# Patient Record
Sex: Female | Born: 1940 | Race: White | Hispanic: No | Marital: Single | Smoking: Never smoker
Health system: Southern US, Community
[De-identification: ages and names within clinical notes are randomized; demographics above are authoritative.]

## PROBLEM LIST (undated history)

## (undated) DIAGNOSIS — E785 Hyperlipidemia, unspecified: Secondary | ICD-10-CM

## (undated) DIAGNOSIS — R011 Cardiac murmur, unspecified: Secondary | ICD-10-CM

## (undated) DIAGNOSIS — K5792 Diverticulitis of intestine, part unspecified, without perforation or abscess without bleeding: Secondary | ICD-10-CM

## (undated) DIAGNOSIS — T7840XA Allergy, unspecified, initial encounter: Secondary | ICD-10-CM

## (undated) DIAGNOSIS — K219 Gastro-esophageal reflux disease without esophagitis: Secondary | ICD-10-CM

## (undated) DIAGNOSIS — B019 Varicella without complication: Secondary | ICD-10-CM

## (undated) DIAGNOSIS — M1990 Unspecified osteoarthritis, unspecified site: Secondary | ICD-10-CM

## (undated) DIAGNOSIS — I10 Essential (primary) hypertension: Secondary | ICD-10-CM

## (undated) DIAGNOSIS — K635 Polyp of colon: Secondary | ICD-10-CM

## (undated) HISTORY — DX: Cardiac murmur, unspecified: R01.1

## (undated) HISTORY — DX: Allergy, unspecified, initial encounter: T78.40XA

## (undated) HISTORY — DX: Polyp of colon: K63.5

## (undated) HISTORY — DX: Varicella without complication: B01.9

## (undated) HISTORY — DX: Gastro-esophageal reflux disease without esophagitis: K21.9

## (undated) HISTORY — DX: Essential (primary) hypertension: I10

## (undated) HISTORY — DX: Hyperlipidemia, unspecified: E78.5

## (undated) HISTORY — DX: Unspecified osteoarthritis, unspecified site: M19.90

## (undated) HISTORY — DX: Diverticulitis of intestine, part unspecified, without perforation or abscess without bleeding: K57.92

---

## 1998-04-16 ENCOUNTER — Ambulatory Visit (HOSPITAL_COMMUNITY): Admission: RE | Admit: 1998-04-16 | Discharge: 1998-04-16 | Payer: Self-pay | Admitting: *Deleted

## 1998-05-04 ENCOUNTER — Other Ambulatory Visit: Admission: RE | Admit: 1998-05-04 | Discharge: 1998-05-04 | Payer: Self-pay | Admitting: *Deleted

## 1999-05-06 ENCOUNTER — Other Ambulatory Visit: Admission: RE | Admit: 1999-05-06 | Discharge: 1999-05-06 | Payer: Self-pay | Admitting: Obstetrics and Gynecology

## 2000-04-29 ENCOUNTER — Other Ambulatory Visit: Admission: RE | Admit: 2000-04-29 | Discharge: 2000-04-29 | Payer: Self-pay | Admitting: *Deleted

## 2001-04-26 ENCOUNTER — Other Ambulatory Visit: Admission: RE | Admit: 2001-04-26 | Discharge: 2001-04-26 | Payer: Self-pay | Admitting: *Deleted

## 2016-10-28 DIAGNOSIS — Z1231 Encounter for screening mammogram for malignant neoplasm of breast: Secondary | ICD-10-CM | POA: Diagnosis not present

## 2016-12-02 DIAGNOSIS — M19011 Primary osteoarthritis, right shoulder: Secondary | ICD-10-CM | POA: Diagnosis not present

## 2016-12-02 DIAGNOSIS — M19012 Primary osteoarthritis, left shoulder: Secondary | ICD-10-CM | POA: Diagnosis not present

## 2016-12-02 DIAGNOSIS — G8929 Other chronic pain: Secondary | ICD-10-CM | POA: Diagnosis not present

## 2016-12-02 DIAGNOSIS — M7550 Bursitis of unspecified shoulder: Secondary | ICD-10-CM | POA: Diagnosis not present

## 2016-12-02 DIAGNOSIS — Z6824 Body mass index (BMI) 24.0-24.9, adult: Secondary | ICD-10-CM | POA: Diagnosis not present

## 2016-12-02 DIAGNOSIS — M25512 Pain in left shoulder: Secondary | ICD-10-CM | POA: Diagnosis not present

## 2016-12-02 DIAGNOSIS — M25511 Pain in right shoulder: Secondary | ICD-10-CM | POA: Diagnosis not present

## 2016-12-04 DIAGNOSIS — G8929 Other chronic pain: Secondary | ICD-10-CM | POA: Diagnosis not present

## 2016-12-04 DIAGNOSIS — M25612 Stiffness of left shoulder, not elsewhere classified: Secondary | ICD-10-CM | POA: Diagnosis not present

## 2016-12-04 DIAGNOSIS — M25511 Pain in right shoulder: Secondary | ICD-10-CM | POA: Diagnosis not present

## 2016-12-04 DIAGNOSIS — M6281 Muscle weakness (generalized): Secondary | ICD-10-CM | POA: Diagnosis not present

## 2016-12-04 DIAGNOSIS — M25512 Pain in left shoulder: Secondary | ICD-10-CM | POA: Diagnosis not present

## 2016-12-08 DIAGNOSIS — G8929 Other chronic pain: Secondary | ICD-10-CM | POA: Diagnosis not present

## 2016-12-08 DIAGNOSIS — M25512 Pain in left shoulder: Secondary | ICD-10-CM | POA: Diagnosis not present

## 2016-12-08 DIAGNOSIS — M25511 Pain in right shoulder: Secondary | ICD-10-CM | POA: Diagnosis not present

## 2016-12-08 DIAGNOSIS — M25612 Stiffness of left shoulder, not elsewhere classified: Secondary | ICD-10-CM | POA: Diagnosis not present

## 2016-12-08 DIAGNOSIS — M6281 Muscle weakness (generalized): Secondary | ICD-10-CM | POA: Diagnosis not present

## 2016-12-23 DIAGNOSIS — M25511 Pain in right shoulder: Secondary | ICD-10-CM | POA: Diagnosis not present

## 2016-12-23 DIAGNOSIS — M25512 Pain in left shoulder: Secondary | ICD-10-CM | POA: Diagnosis not present

## 2016-12-23 DIAGNOSIS — M6281 Muscle weakness (generalized): Secondary | ICD-10-CM | POA: Diagnosis not present

## 2016-12-23 DIAGNOSIS — G8929 Other chronic pain: Secondary | ICD-10-CM | POA: Diagnosis not present

## 2016-12-23 DIAGNOSIS — M25612 Stiffness of left shoulder, not elsewhere classified: Secondary | ICD-10-CM | POA: Diagnosis not present

## 2016-12-30 DIAGNOSIS — M25512 Pain in left shoulder: Secondary | ICD-10-CM | POA: Diagnosis not present

## 2016-12-30 DIAGNOSIS — M25511 Pain in right shoulder: Secondary | ICD-10-CM | POA: Diagnosis not present

## 2016-12-30 DIAGNOSIS — M6281 Muscle weakness (generalized): Secondary | ICD-10-CM | POA: Diagnosis not present

## 2016-12-30 DIAGNOSIS — G8929 Other chronic pain: Secondary | ICD-10-CM | POA: Diagnosis not present

## 2016-12-30 DIAGNOSIS — M25612 Stiffness of left shoulder, not elsewhere classified: Secondary | ICD-10-CM | POA: Diagnosis not present

## 2017-01-06 DIAGNOSIS — G8929 Other chronic pain: Secondary | ICD-10-CM | POA: Diagnosis not present

## 2017-01-06 DIAGNOSIS — M6281 Muscle weakness (generalized): Secondary | ICD-10-CM | POA: Diagnosis not present

## 2017-01-06 DIAGNOSIS — M25612 Stiffness of left shoulder, not elsewhere classified: Secondary | ICD-10-CM | POA: Diagnosis not present

## 2017-01-06 DIAGNOSIS — M25511 Pain in right shoulder: Secondary | ICD-10-CM | POA: Diagnosis not present

## 2017-01-06 DIAGNOSIS — M25512 Pain in left shoulder: Secondary | ICD-10-CM | POA: Diagnosis not present

## 2017-01-12 DIAGNOSIS — M25612 Stiffness of left shoulder, not elsewhere classified: Secondary | ICD-10-CM | POA: Diagnosis not present

## 2017-03-06 DIAGNOSIS — R42 Dizziness and giddiness: Secondary | ICD-10-CM | POA: Diagnosis not present

## 2017-03-06 DIAGNOSIS — E785 Hyperlipidemia, unspecified: Secondary | ICD-10-CM | POA: Diagnosis not present

## 2017-03-06 DIAGNOSIS — I10 Essential (primary) hypertension: Secondary | ICD-10-CM | POA: Diagnosis not present

## 2017-03-06 DIAGNOSIS — Z6824 Body mass index (BMI) 24.0-24.9, adult: Secondary | ICD-10-CM | POA: Diagnosis not present

## 2017-04-01 DIAGNOSIS — H81399 Other peripheral vertigo, unspecified ear: Secondary | ICD-10-CM | POA: Diagnosis not present

## 2017-04-01 DIAGNOSIS — H90A22 Sensorineural hearing loss, unilateral, left ear, with restricted hearing on the contralateral side: Secondary | ICD-10-CM | POA: Diagnosis not present

## 2017-04-01 DIAGNOSIS — R42 Dizziness and giddiness: Secondary | ICD-10-CM | POA: Diagnosis not present

## 2017-04-01 DIAGNOSIS — Z6825 Body mass index (BMI) 25.0-25.9, adult: Secondary | ICD-10-CM | POA: Diagnosis not present

## 2017-04-14 DIAGNOSIS — Z85828 Personal history of other malignant neoplasm of skin: Secondary | ICD-10-CM | POA: Diagnosis not present

## 2017-04-14 DIAGNOSIS — Z08 Encounter for follow-up examination after completed treatment for malignant neoplasm: Secondary | ICD-10-CM | POA: Diagnosis not present

## 2017-04-14 DIAGNOSIS — L570 Actinic keratosis: Secondary | ICD-10-CM | POA: Diagnosis not present

## 2017-04-24 DIAGNOSIS — H353131 Nonexudative age-related macular degeneration, bilateral, early dry stage: Secondary | ICD-10-CM | POA: Diagnosis not present

## 2017-04-24 DIAGNOSIS — H40013 Open angle with borderline findings, low risk, bilateral: Secondary | ICD-10-CM | POA: Diagnosis not present

## 2017-04-24 DIAGNOSIS — H2513 Age-related nuclear cataract, bilateral: Secondary | ICD-10-CM | POA: Diagnosis not present

## 2017-04-24 DIAGNOSIS — H1851 Endothelial corneal dystrophy: Secondary | ICD-10-CM | POA: Diagnosis not present

## 2017-04-24 DIAGNOSIS — H04123 Dry eye syndrome of bilateral lacrimal glands: Secondary | ICD-10-CM | POA: Diagnosis not present

## 2017-09-04 DIAGNOSIS — Z79899 Other long term (current) drug therapy: Secondary | ICD-10-CM | POA: Diagnosis not present

## 2017-09-04 DIAGNOSIS — E785 Hyperlipidemia, unspecified: Secondary | ICD-10-CM | POA: Diagnosis not present

## 2017-09-04 DIAGNOSIS — R42 Dizziness and giddiness: Secondary | ICD-10-CM | POA: Diagnosis not present

## 2017-09-04 DIAGNOSIS — I10 Essential (primary) hypertension: Secondary | ICD-10-CM | POA: Diagnosis not present

## 2017-10-05 DIAGNOSIS — M25531 Pain in right wrist: Secondary | ICD-10-CM | POA: Diagnosis not present

## 2017-10-05 DIAGNOSIS — M1811 Unilateral primary osteoarthritis of first carpometacarpal joint, right hand: Secondary | ICD-10-CM | POA: Diagnosis not present

## 2017-10-05 DIAGNOSIS — M8588 Other specified disorders of bone density and structure, other site: Secondary | ICD-10-CM | POA: Diagnosis not present

## 2017-10-05 DIAGNOSIS — S6981XA Other specified injuries of right wrist, hand and finger(s), initial encounter: Secondary | ICD-10-CM | POA: Diagnosis not present

## 2017-10-20 DIAGNOSIS — S6981XD Other specified injuries of right wrist, hand and finger(s), subsequent encounter: Secondary | ICD-10-CM | POA: Diagnosis not present

## 2018-04-20 DIAGNOSIS — I10 Essential (primary) hypertension: Secondary | ICD-10-CM | POA: Diagnosis not present

## 2018-04-20 DIAGNOSIS — E785 Hyperlipidemia, unspecified: Secondary | ICD-10-CM | POA: Diagnosis not present

## 2018-04-20 DIAGNOSIS — T753XXA Motion sickness, initial encounter: Secondary | ICD-10-CM | POA: Diagnosis not present

## 2018-04-30 DIAGNOSIS — H1851 Endothelial corneal dystrophy: Secondary | ICD-10-CM | POA: Diagnosis not present

## 2018-04-30 DIAGNOSIS — H2513 Age-related nuclear cataract, bilateral: Secondary | ICD-10-CM | POA: Diagnosis not present

## 2018-05-10 DIAGNOSIS — Z08 Encounter for follow-up examination after completed treatment for malignant neoplasm: Secondary | ICD-10-CM | POA: Diagnosis not present

## 2018-05-10 DIAGNOSIS — Z86008 Personal history of in-situ neoplasm of other site: Secondary | ICD-10-CM | POA: Diagnosis not present

## 2018-05-10 DIAGNOSIS — C44519 Basal cell carcinoma of skin of other part of trunk: Secondary | ICD-10-CM | POA: Diagnosis not present

## 2018-05-10 DIAGNOSIS — L821 Other seborrheic keratosis: Secondary | ICD-10-CM | POA: Diagnosis not present

## 2018-05-10 DIAGNOSIS — Z85828 Personal history of other malignant neoplasm of skin: Secondary | ICD-10-CM | POA: Diagnosis not present

## 2018-05-10 DIAGNOSIS — D485 Neoplasm of uncertain behavior of skin: Secondary | ICD-10-CM | POA: Diagnosis not present

## 2018-05-19 DIAGNOSIS — C44519 Basal cell carcinoma of skin of other part of trunk: Secondary | ICD-10-CM | POA: Diagnosis not present

## 2018-08-21 NOTE — Progress Notes (Addendum)
[place] at [facility] [address] [place], [place] [address] [phone]  Date:  08/25/2018   Name:  Katrina Lucas   DOB:  03-17-1941   MRN:  [id number]  PCP:  Darreld Mclean, MD    Chief Complaint: New Patient (Initial Visit) (immunizations)   History of Present Illness:  Katrina Lucas is a 77 y.o. 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 She had worked at 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 Valora Piccolo, but he left the practice so she decided to change to this office Labs done in April of this year- lipids done then   She has not had her pneumovax 23 yet She did have a tetanus in 2011 She had zostavax about 4 years ago we think   Pt notes a family history of CV disease  Her mother died of a stroke Her sister had AV replacement a year ago, and is having a stent put in her arm and probably in her carotid Brother died of an MI at age 81, her father died of MI at age 66  Pt does not have any CP  She has done a stress test several years ago  Last BP was 132/72 at her last MD office in  April No HA  She did take her BP meds this am   Patient Active Problem List   Diagnosis Date Noted  . Essential hypertension 08/25/2018  . Peripheral polyneuropathy 08/25/2018  . Dyslipidemia 08/25/2018  . Bilateral hearing loss 08/25/2018    Past Medical History:  Diagnosis Date  . Allergy   . Arthritis   . Chicken pox   . Colon polyp   . Diverticulitis   . GERD (gastroesophageal reflux disease)   . Heart murmur   . Hyperlipidemia   . Hypertension     History reviewed. No pertinent surgical history.  Social History   Tobacco Use  . Smoking status: Never Smoker  . Smokeless tobacco: Never Used  Substance Use Topics  . Alcohol use: Yes    Comment: 5 times a week  . Drug use: Never    History reviewed. No pertinent family history.  Allergies  Allergen Reactions  . Sulfa Antibiotics Shortness Of Breath  . Aspirin     Microscopic bleeding Microscopic bleeding   . Codeine Nausea And Vomiting    Medication list has been reviewed and updated.  Current Outpatient Medications on File Prior to Visit  Medication Sig Dispense Refill  .  acetaminophen (TYLENOL) 325 MG tablet Take by mouth.    . DOCOSAHEXAENOIC ACID PO Take by mouth.    Marland Kitchen UNABLE TO FIND Med Name: Align GI one capsule daily    . vitamin B-12 (CYANOCOBALAMIN) 1000 MCG tablet Take by mouth.     No current facility-administered medications on file prior to visit.     Review of Systems:  As per HPI- otherwise negative. No fever or chills Her feet do not hurt or burn  No rash No CP with activity    Physical Examination: Vitals:   08/25/18 0958  BP: (!) 162/78  Pulse: 72  Resp: 16  Temp: 97.9 F (36.6 C)  SpO2: 97%   Vitals:   08/25/18 0958  Weight: 169 lb (76.7 kg)  Height: 5' 9.5" (1.765 m)   Body mass index is 24.6 kg/m. Ideal Body Weight: Weight in (lb) to have BMI = 25: 171.4  GEN: WDWN, NAD, Non-toxic, A & O x 3, looks well, normal weight  Mild HOH HEENT: Atraumatic,  Normocephalic. Neck supple. No masses, No LAD.  Wearing hearing aids, oropharynx normal.  PEERL,EOMI.   Ears and Nose: No external deformity. CV: RRR, No M/G/R. No JVD. No thrill. No extra heart sounds. PULM: CTA B, no wheezes, crackles, rhonchi. No retractions. No resp. distress. No accessory muscle use. EXTR: No c/c/e NEURO Normal gait.  PSYCH: Normally interactive. Conversant. Not depressed or anxious appearing.  Calm demeanor.    Assessment and Plan: Immunization due - Plan: Pneumococcal polysaccharide vaccine 23-valent greater than or equal to 2yo subcutaneous/IM  Screening for osteoporosis - Plan: DG Bone Density  Estrogen deficiency - Plan: DG Bone Density  Essential hypertension  Dyslipidemia  Hypercalcemia - Plan: Basic metabolic panel  Medication monitoring encounter - Plan: Basic metabolic panel  Peripheral polyneuropathy  Bilateral hearing loss, unspecified hearing loss type  BP is elevated today- she will come in for a nurse visit and recheck next week.  If still high consider adjusting her medications Ordered dexa for her Noted her calcium was a bit elevated on her last labs, repeat today She does not wish to start on any medication for her neuropathy at this time.  She wears shoes to protect her feet Plan to visit in April for a physical and routine labs Will plan further follow- up pending labs. Pneumovax today She will do her flu shot later this fall Discussed shingrix- just had zostavax in 2015 so too early    Signed Lamar Blinks, MD  Received her labs as below, letter to pt  Results for orders placed or performed in visit on 81/27/51  Basic metabolic panel  Result Value Ref Range   Sodium 138 135 - 145 mEq/L   Potassium 4.2 3.5 - 5.1 mEq/L   Chloride 102 96 - 112 mEq/L   CO2 30 19 - 32 mEq/L   Glucose, Bld 96 70 - 99 mg/dL   BUN 15 6 - 23 mg/dL   Creatinine, Ser 0.84 0.40 - 1.20 mg/dL   Calcium 10.4 8.4 - 10.5 mg/dL   GFR 69.48 >60.00  mL/min

## 2018-08-25 ENCOUNTER — Ambulatory Visit (INDEPENDENT_AMBULATORY_CARE_PROVIDER_SITE_OTHER): Payer: Medicare Other | Admitting: Family Medicine

## 2018-08-25 ENCOUNTER — Encounter: Payer: Self-pay | Admitting: Family Medicine

## 2018-08-25 VITALS — BP 162/78 | HR 72 | Temp 97.9°F | Resp 16 | Ht 69.5 in | Wt 169.0 lb

## 2018-08-25 DIAGNOSIS — I10 Essential (primary) hypertension: Secondary | ICD-10-CM | POA: Diagnosis not present

## 2018-08-25 DIAGNOSIS — Z13820 Encounter for screening for osteoporosis: Secondary | ICD-10-CM | POA: Diagnosis not present

## 2018-08-25 DIAGNOSIS — H9193 Unspecified hearing loss, bilateral: Secondary | ICD-10-CM

## 2018-08-25 DIAGNOSIS — E785 Hyperlipidemia, unspecified: Secondary | ICD-10-CM | POA: Insufficient documentation

## 2018-08-25 DIAGNOSIS — Z23 Encounter for immunization: Secondary | ICD-10-CM

## 2018-08-25 DIAGNOSIS — G629 Polyneuropathy, unspecified: Secondary | ICD-10-CM | POA: Insufficient documentation

## 2018-08-25 DIAGNOSIS — E2839 Other primary ovarian failure: Secondary | ICD-10-CM

## 2018-08-25 DIAGNOSIS — Z5181 Encounter for therapeutic drug level monitoring: Secondary | ICD-10-CM

## 2018-08-25 LAB — BASIC METABOLIC PANEL
BUN: 15 mg/dL (ref 6–23)
CALCIUM: 10.4 mg/dL (ref 8.4–10.5)
CO2: 30 mEq/L (ref 19–32)
CREATININE: 0.84 mg/dL (ref 0.40–1.20)
Chloride: 102 mEq/L (ref 96–112)
GFR: 69.48 mL/min (ref >60.00)
Glucose, Bld: 96 mg/dL (ref 70–99)
Potassium: 4.2 mEq/L (ref 3.5–5.1)
SODIUM: 138 meq/L (ref 135–145)

## 2018-08-25 MED ORDER — LOSARTAN POTASSIUM-HCTZ 50-12.5 MG PO TABS: 1.0000 | ORAL_TABLET | Freq: Every day | ORAL | 3 refills | Status: DC

## 2018-08-25 MED ORDER — PRAVASTATIN SODIUM 20 MG PO TABS: 20.0000 mg | ORAL_TABLET | Freq: Every day | ORAL | 3 refills | Status: DC

## 2018-08-25 NOTE — Patient Instructions (Signed)
It was good to see you again today!  You got your 2nd pneumonia vaccine today Don't forget a flu shot later on this fall I will check on your calcium today We will do a nurse visit in about one week and adjust your BP meds if needed   I also ordered a bone density scan for you- I will set this up

## 2018-09-01 ENCOUNTER — Ambulatory Visit (INDEPENDENT_AMBULATORY_CARE_PROVIDER_SITE_OTHER): Payer: Medicare Other | Admitting: Family Medicine

## 2018-09-01 VITALS — BP 145/81 | HR 60

## 2018-09-01 DIAGNOSIS — I10 Essential (primary) hypertension: Secondary | ICD-10-CM | POA: Diagnosis not present

## 2018-09-01 NOTE — Progress Notes (Signed)
Pre visit review using our clinic tool,if applicable. No additional management support is needed unless otherwise documented below in the visit note.   Pt here for Blood pressure check per order from Dr. Lenna Sciara. [place]  Dated 08/25/18.  Pt currently takes: Losartan -Hctz 50-12.5 mg  No complaints voiced this visit.   Pt reports compliance with medication.  BP today @ = 145/81 P = 60  Pt advised per Dr. Lorelei Pont to continue medications as ordered and return in 3 months for BP follow up visit.

## 2018-09-06 NOTE — Progress Notes (Signed)
I have reviewed the associated BP check note by Santiago Glad and agreeDenny Peon MD

## 2018-09-22 DIAGNOSIS — C44519 Basal cell carcinoma of skin of other part of trunk: Secondary | ICD-10-CM | POA: Diagnosis not present

## 2018-09-27 ENCOUNTER — Ambulatory Visit (HOSPITAL_BASED_OUTPATIENT_CLINIC_OR_DEPARTMENT_OTHER)
Admission: RE | Admit: 2018-09-27 | Discharge: 2018-09-27 | Disposition: A | Discharge status: Home / Self Care | Payer: Medicare Other | Source: Ambulatory Visit | Attending: Family Medicine | Admitting: Family Medicine

## 2018-09-27 DIAGNOSIS — Z13820 Encounter for screening for osteoporosis: Secondary | ICD-10-CM

## 2018-09-27 DIAGNOSIS — E2839 Other primary ovarian failure: Secondary | ICD-10-CM | POA: Diagnosis not present

## 2018-09-27 DIAGNOSIS — M85852 Other specified disorders of bone density and structure, left thigh: Secondary | ICD-10-CM | POA: Diagnosis not present

## 2018-09-27 DIAGNOSIS — M810 Age-related osteoporosis without current pathological fracture: Secondary | ICD-10-CM | POA: Insufficient documentation

## 2018-09-27 DIAGNOSIS — M85851 Other specified disorders of bone density and structure, right thigh: Secondary | ICD-10-CM | POA: Diagnosis not present

## 2018-09-28 ENCOUNTER — Encounter: Payer: Self-pay | Admitting: Family Medicine

## 2018-09-30 DIAGNOSIS — H2513 Age-related nuclear cataract, bilateral: Secondary | ICD-10-CM | POA: Diagnosis not present

## 2018-09-30 DIAGNOSIS — H1851 Endothelial corneal dystrophy: Secondary | ICD-10-CM | POA: Diagnosis not present

## 2018-10-18 ENCOUNTER — Telehealth: Payer: Self-pay | Admitting: Family Medicine

## 2018-10-18 NOTE — Telephone Encounter (Signed)
Copied from [place] [id number]. Topic: General - Other >> Oct 18, 2018  9:36 AM Oneta Rack wrote:  Relation to pt: self  Call back number: [phone] Pharmacy: [facility], [place] to [facility] [phone] (Phone) [phone] (Fax)  Reason for call:  Patient received 09/28/18 letter from Dr. Lorelei Pont informing patient she would like her to start Fosamax, patient would like Rx sent to [place], [place] to [facility] [phone] (Phone) and  [phone] (Fax) , informed patient please allow 48 to 72 hour turn around time, please advise

## 2018-10-18 NOTE — Telephone Encounter (Signed)
See pt. Request. Thanks. 

## 2018-10-19 MED ORDER — ALENDRONATE SODIUM 70 MG PO TABS: 70.0000 mg | ORAL_TABLET | ORAL | 3 refills | Status: DC

## 2018-10-19 NOTE — Telephone Encounter (Signed)
Done

## 2018-10-26 DIAGNOSIS — H25811 Combined forms of age-related cataract, right eye: Secondary | ICD-10-CM | POA: Diagnosis not present

## 2018-10-26 DIAGNOSIS — H2511 Age-related nuclear cataract, right eye: Secondary | ICD-10-CM | POA: Diagnosis not present

## 2018-10-30 NOTE — Progress Notes (Signed)
[place] at [facility] [address] [place], [place] [address] [phone]  Date:  11/01/2018   Name:  Katrina Lucas   DOB:  [birth year]   MRN:  [id number]  PCP:  Darreld Mclean, MD    Chief Complaint: Hypertension (follow up )   History of Present Illness:  Katrina Lucas is a 77 y.o. very pleasant female patient who presents with the following:  Following up on her BP today Last seen here in early September at which time her BP was high- had her come back in for a nurse visit later in September and this was improved.  Checking in today  BP Readings from Last 3 Encounters:  11/01/18 138/90  09/01/18 (!) 145/81  08/25/18 (!) 162/78   Flu: done at public  She is on losartan/hctz and also pravachol  She had cataract surgery on one eye last week, the other eye is due next week She is hoping that this will improver her ability to read She is feeling well   She is checking her BP at home every couple of days Her SBP is often over 140s/ 150s    Patient Active Problem List   Diagnosis Date Noted  . Essential hypertension 08/25/2018  . Peripheral polyneuropathy 08/25/2018  . Dyslipidemia 08/25/2018  . Bilateral hearing loss 08/25/2018    Past Medical History:  Diagnosis Date  . Allergy   . Arthritis   . Chicken pox   . Colon polyp   . Diverticulitis   . GERD (gastroesophageal reflux disease)   . Heart murmur   . Hyperlipidemia   . Hypertension     No past surgical history on file.  Social History   Tobacco Use  . Smoking status: Never Smoker  . Smokeless tobacco: Never Used  Substance Use Topics  . Alcohol use: Yes    Comment: 5 times a week  . Drug use: Never    No family history on file.  Allergies  Allergen Reactions  . Sulfa Antibiotics Shortness Of Breath  . Aspirin     Microscopic bleeding Microscopic bleeding   . Codeine Nausea And Vomiting    Medication list has been  reviewed and updated.  Current Outpatient Medications on File Prior to Visit  Medication Sig Dispense Refill  . acetaminophen (TYLENOL) 325 MG tablet Take 650 mg by mouth.     Marland Kitchen alendronate (FOSAMAX) 70 MG tablet Take 1 tablet (70 mg total) by mouth every 7 (seven) days. Take with a full glass of water on an empty stomach. 12 tablet 3  . DOCOSAHEXAENOIC ACID PO Take by mouth.    . losartan-hydrochlorothiazide (HYZAAR) 50-12.5 MG tablet Take 1 tablet by mouth daily. 90 tablet 3  . pravastatin (PRAVACHOL) 20 MG tablet Take 1 tablet (20 mg total) by mouth daily. 90 tablet 3  . UNABLE TO FIND Med Name: Align GI one capsule daily    . vitamin B-12 (CYANOCOBALAMIN) 1000 MCG tablet Take by mouth.     No current facility-administered medications on file prior to visit.     Review of Systems:  As per HPI- otherwise negative.   Physical Examination: Vitals:   11/01/18 1337  BP: 138/90  Pulse: 94  Resp: 18  Temp: (!) 97.5 F (36.4 C)  SpO2: 98%   Vitals:   11/01/18 1337  Weight: 170 lb (77.1 kg)  Height: 5' 9.5" (1.765 m)   Body mass index is 24.74  kg/m. Ideal Body Weight: Weight in (lb) to have BMI = 25: 171.4  GEN: WDWN, NAD, Non-toxic, A & O x 3, looks well  HEENT: Atraumatic, Normocephalic. Neck supple. No masses, No LAD. Ears and Nose: No external deformity. CV: RRR, No M/G/R. No JVD. No thrill. No extra heart sounds. PULM: CTA B, no wheezes, crackles, rhonchi. No retractions. No resp. distress. No accessory muscle use. EXTR: No c/c/e NEURO Normal gait.  PSYCH: Normally interactive. Conversant. Not depressed or anxious appearing.  Calm demeanor.    Assessment and Plan: Essential hypertension  Following up on her BP today She has a lot of losartan/hct 50/12.5 on hand and will try taking 1.5 pills.  She will update me with some readings in a few weeks    Signed Lamar Blinks, MD

## 2018-11-01 ENCOUNTER — Ambulatory Visit (INDEPENDENT_AMBULATORY_CARE_PROVIDER_SITE_OTHER): Payer: Medicare Other | Admitting: Family Medicine

## 2018-11-01 ENCOUNTER — Encounter: Payer: Self-pay | Admitting: Family Medicine

## 2018-11-01 VITALS — BP 138/90 | HR 94 | Temp 97.5°F | Resp 18 | Ht 69.5 in | Wt 170.0 lb

## 2018-11-01 DIAGNOSIS — I10 Essential (primary) hypertension: Secondary | ICD-10-CM | POA: Diagnosis not present

## 2018-11-01 NOTE — Patient Instructions (Signed)
Let's increase your BP med to 1.5 pills a day  Please let me know how your BP responds to this- you can send me a list of your readings in a few weeks

## 2018-11-16 DIAGNOSIS — H2512 Age-related nuclear cataract, left eye: Secondary | ICD-10-CM | POA: Diagnosis not present

## 2018-12-08 ENCOUNTER — Ambulatory Visit: Payer: Medicare Other | Admitting: Family Medicine

## 2018-12-09 ENCOUNTER — Telehealth: Payer: Self-pay | Admitting: Family Medicine

## 2018-12-09 NOTE — Telephone Encounter (Signed)
Copied from [place] [id number]. Topic: Quick Communication - See Telephone Encounter >> Dec 09, 2018 11:35 AM Rosalin Hawking wrote: CRM for notification. See Telephone encounter for: 12/09/18.   Pt dropped off document for provider to see and have on pt's chart (white envelope with letter and info about pt's BP) Document put at front office tray under providers name.

## 2018-12-09 NOTE — Telephone Encounter (Signed)
Forwarded to provider/SLS 12/19

## 2018-12-11 ENCOUNTER — Telehealth: Payer: Self-pay | Admitting: Family Medicine

## 2018-12-11 NOTE — Telephone Encounter (Signed)
Pt brought me a list of some BP readings since she increased her dosage of losartan/hctz to 1.5 pills per day.  Overall they look reasonable- a few are higher than goal but generally ok .    Called pt and LMOM to continue current dosage.

## 2019-01-31 ENCOUNTER — Telehealth: Payer: Self-pay | Admitting: Family Medicine

## 2019-01-31 DIAGNOSIS — I10 Essential (primary) hypertension: Secondary | ICD-10-CM

## 2019-01-31 MED ORDER — LOSARTAN POTASSIUM-HCTZ 50-12.5 MG PO TABS: 1.5000 | ORAL_TABLET | Freq: Every day | ORAL | 3 refills | Status: DC

## 2019-01-31 MED ORDER — PRAVASTATIN SODIUM 20 MG PO TABS: 20.0000 mg | ORAL_TABLET | Freq: Every day | ORAL | 3 refills | Status: DC

## 2019-01-31 NOTE — Telephone Encounter (Signed)
Patient needs Pravastatin 20 mg refill. She also needs a new prescription of her blood pressure medication Hyzaar filled with a higher dose. She has been taking 1.5 tabs daily.  Please advise.

## 2019-01-31 NOTE — Telephone Encounter (Signed)
Please advise on increase of Hyzaar. I have sent pravastatin to pharmacy.

## 2019-04-11 ENCOUNTER — Telehealth: Payer: Self-pay | Admitting: Family Medicine

## 2019-04-11 NOTE — Telephone Encounter (Signed)
Patient called and stated she would like to be put on a new blood pressure medication and she wants it to be one that she no longer has to break in half. Please contact patient [phone] (mobile)

## 2019-04-12 MED ORDER — LOSARTAN POTASSIUM-HCTZ 100-12.5 MG PO TABS: 1.0000 | ORAL_TABLET | Freq: Every day | ORAL | 3 refills | Status: DC

## 2019-04-12 NOTE — Addendum Note (Signed)
Addended by: Lamar Blinks C on: 04/12/2019 01:50 PM   Modules accepted: Orders

## 2019-04-12 NOTE — Telephone Encounter (Signed)
Called her back- she is currently taking losartan 75 and hctz 18.75 Will change her to losartan 100/hctz 12.5 which should act about the same as far as lowering her BP Meds ordered this encounter  Medications  . losartan-hydrochlorothiazide (HYZAAR) 100-12.5 MG tablet    Sig: Take 1 tablet by mouth daily.    Dispense:  90 tablet    Refill:  3     BP Readings from Last 3 Encounters:  11/01/18 138/90  09/01/18 (!) 145/81  08/25/18 (!) 162/78   Will have my nurse call her and set up a recheck visit in a month or so Michele Mcalpine please give her a call and set up a follow-up appt for 6-8 weeks from now to check on her BP- thank you

## 2019-04-20 ENCOUNTER — Telehealth: Payer: Self-pay | Admitting: Family Medicine

## 2019-04-20 MED ORDER — PRAVASTATIN SODIUM 20 MG PO TABS: 20.0000 mg | ORAL_TABLET | Freq: Every day | ORAL | 0 refills | Status: DC

## 2019-04-20 NOTE — Telephone Encounter (Signed)
Refill has been sent to pharmacy.  

## 2019-04-20 NOTE — Telephone Encounter (Signed)
Copied from [place] [id number]. Topic: Quick Communication - Rx Refill/Question >> Apr 20, 2019 10:04 AM Virl Axe D wrote: Medication: pravastatin (PRAVACHOL) 20 MG tablet  Has the patient contacted their pharmacy? Yes.   (Agent: If no, request that the patient contact the pharmacy for the refill.) (Agent: If yes, when and what did the pharmacy advise?)  Preferred Pharmacy (with phone number or street name): [facility] [place], [place] to [facility] [phone] (Phone) [phone] (Fax)    Agent: Please be advised that RX refills may take up to 3 business days. We ask that you follow-up with your pharmacy.

## 2019-08-20 NOTE — Progress Notes (Addendum)
[place] at [facility] [address] [place], [place] [address] [phone]  Date:  08/31/2019   Name:  Katrina Lucas   DOB:  [birth year]   MRN:  [id number]  PCP:  Darreld Mclean, MD    Chief Complaint: Annual Exam (delcines flu shot today)   History of Present Illness:  Katrina Lucas is a 78 y.o. very pleasant female patient who presents with the following:  Here today for a CPE History of HTN, hyperlipidemia, polyneuropathy  Last seen by myself in November of last year   Flu: declines today - will do in October at Day: due- she is fasting for labs today Mammo: last done at a different location, unsure of exact date.  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  Worse at night when she goes to bed   Fosamax Pravachol hyzaar   She does check her BP at home- might run 130s/60s generally   She is doing a lot of reading, tries to get exercise  Patient Active Problem List   Diagnosis Date Noted  . Essential hypertension 08/25/2018  . Peripheral polyneuropathy 08/25/2018  . Dyslipidemia 08/25/2018  . Bilateral hearing loss 08/25/2018    Past Medical History:  Diagnosis Date  . Allergy   . Arthritis   . Chicken pox   . Colon polyp   . Diverticulitis   . GERD (gastroesophageal reflux disease)   . Heart murmur   . Hyperlipidemia   . Hypertension     History reviewed. No pertinent surgical history.  Social  History   Tobacco Use  . Smoking status: Never Smoker  . Smokeless tobacco: Never Used  Substance Use Topics  . Alcohol use: Yes    Comment: 5 times a week  . Drug use: Never    History reviewed. No pertinent family history.  Allergies  Allergen Reactions  . Sulfa Antibiotics Shortness Of Breath  . Aspirin     Microscopic bleeding Microscopic bleeding   . Codeine Nausea And Vomiting    Medication list has been reviewed and updated.  Current Outpatient Medications on File Prior to Visit  Medication Sig Dispense Refill  . acetaminophen (TYLENOL) 325 MG tablet Take 650 mg by mouth.     Marland Kitchen alendronate (FOSAMAX) 70 MG tablet Take 1 tablet (70 mg total) by mouth every 7 (seven) days. Take with a full glass of water on an empty stomach. 12 tablet 3  . cetirizine (ZYRTEC) 10 MG tablet Take 10 mg by mouth daily.    . DOCOSAHEXAENOIC ACID PO Take by mouth.    Marland Kitchen LORazepam (ATIVAN) 0.5 MG tablet Take 0.5 mg by mouth as needed (dizziness).    Marland Kitchen losartan-hydrochlorothiazide (HYZAAR) 100-12.5 MG tablet Take 1 tablet by mouth daily. 90 tablet 3  . Multiple Vitamins-Minerals (CENTRUM SILVER 50+WOMEN PO) Take by mouth.    . pravastatin (  PRAVACHOL) 20 MG tablet Take 1 tablet (20 mg total) by mouth daily. 90 tablet 0  . UNABLE TO FIND Med Name: Align GI one capsule daily    . vitamin B-12 (CYANOCOBALAMIN) 1000 MCG tablet Take by mouth.     No current facility-administered medications on file prior to visit.     Review of Systems:  As per HPI- otherwise negative.  No fever or chills, no chest pain or shortness of breath Physical Examination: Vitals:   08/31/19 0857  BP: 112/72  Pulse: 71  Resp: 16  Temp: (!) 97.2 F (36.2 C)  SpO2: 98%   Vitals:   08/31/19 0857  Weight: 173 lb (78.5 kg)  Height: 5' 9.5" (1.765 m)   Body mass index is 25.18 kg/m. Ideal Body Weight: Weight in (lb) to have BMI = 25: 171.4  GEN: WDWN, NAD, Non-toxic, A & O x 3, looks great for age  51:  Atraumatic, Normocephalic. Neck supple. No masses, No LAD.  Bilateral TM wnl, oropharynx normal.  PEERL,EOMI.   Bilateral hearing aids Ears and Nose: No external deformity. CV: RRR, No M/G/R. No JVD. No thrill. No extra heart sounds. PULM: CTA B, no wheezes, crackles, rhonchi. No retractions. No resp. distress. No accessory muscle use. ABD: S, NT, ND, +BS. No rebound. No HSM. EXTR: No c/c/e NEURO Normal gait.  PSYCH: Normally interactive. Conversant. Not depressed or anxious appearing.  Calm demeanor.    Assessment and Plan:   ICD-10-CM   1. Physical exam  Z00.00   2. Essential hypertension  I10 CBC    Comprehensive metabolic panel    losartan-hydrochlorothiazide (HYZAAR) 100-12.5 MG tablet  3. Dyslipidemia  E78.5 Lipid panel    pravastatin (PRAVACHOL) 20 MG tablet  4. Screening for diabetes mellitus  Z13.1 Hemoglobin A1c  5. Screening for breast cancer  Z12.39 MM 3D SCREEN BREAST BILATERAL  6. Screening for colon cancer  Z12.11   7. Age related osteoporosis, unspecified pathological fracture presence  M81.0 alendronate (FOSAMAX) 70 MG tablet   Complete physical today Refill medications Blood pressure under good control She will get her flu shot later this year, recommended Shingrix Ordered screening mammogram Due for colon cancer screening this year, she will contact her GI doctor arrange Will plan further follow- up pending labs.   Follow-up: No follow-ups on file.  Meds ordered this encounter  Medications  . alendronate (FOSAMAX) 70 MG tablet    Sig: Take 1 tablet (70 mg total) by mouth every 7 (seven) days. Take with a full glass of water on an empty stomach.    Dispense:  12 tablet    Refill:  3  . pravastatin (PRAVACHOL) 20 MG tablet    Sig: Take 1 tablet (20 mg total) by mouth daily.    Dispense:  90 tablet    Refill:  3  . losartan-hydrochlorothiazide (HYZAAR) 100-12.5 MG tablet    Sig: Take 1 tablet by mouth daily.    Dispense:  90 tablet    Refill:  3    Orders Placed This Encounter  Procedures  . MM 3D SCREEN BREAST BILATERAL  . CBC  . Comprehensive metabolic panel  . Hemoglobin A1c  . Lipid panel    @SIGN @    Signed Lamar Blinks, MD  Received her labs, letter to patient  Results for orders placed or performed in visit on 08/31/19  CBC  Result Value Ref Range   WBC 7.7 4.0 - 10.5 K/uL   RBC 4.72 3.87 - 5.11 Mil/uL  Platelets 294.0 150.0 - 400.0 K/uL   Hemoglobin 14.0 12.0 - 15.0 g/dL   HCT 42.2 36.0 - 46.0 %   MCV 89.5 78.0 - 100.0 fl   MCHC 33.1 30.0 - 36.0 g/dL   RDW 12.6 11.5 - 15.5 %  Comprehensive metabolic panel  Result Value Ref Range   Sodium 137 135 - 145 mEq/L   Potassium 3.5 3.5 - 5.1 mEq/L   Chloride 101 96 - 112 mEq/L   CO2 30 19 - 32 mEq/L   Glucose, Bld 93 70 - 99 mg/dL   BUN 20 6 - 23 mg/dL   Creatinine, Ser 0.76 0.40 - 1.20 mg/dL   Total Bilirubin 0.5 0.2 - 1.2 mg/dL   Alkaline Phosphatase 52 39 - 117 U/L   AST 19 0 - 37 U/L   ALT 17 0 - 35 U/L   Total Protein 7.3 6.0 - 8.3 g/dL   Albumin 4.4 3.5 - 5.2 g/dL   Calcium 9.9 8.4 - 10.5 mg/dL   GFR 73.18 >60.00 mL/min  Hemoglobin A1c  Result Value Ref Range   Hgb A1c MFr Bld 5.8 4.6 - 6.5 %  Lipid panel  Result Value Ref Range   Cholesterol 166 0 - 200 mg/dL   Triglycerides 304.0 (H) 0.0 - 149.0 mg/dL   HDL 38.70 (L) >39.00 mg/dL   VLDL 60.8 (H) 0.0 - 40.0 mg/dL   Total CHOL/HDL Ratio 4    NonHDL 127.18   LDL cholesterol, direct  Result Value Ref Range   Direct LDL 81.0 mg/dL

## 2019-08-20 NOTE — Patient Instructions (Addendum)
It was great to see you again today- I will be in touch with your labs asap I think you are due for the shingles vaccine at your convenience- can do at your drug store  Also be sure to get your flu shot later this fall  Ordered mammogram for you to have done here at the [facility] at your convenience Please do contact your GI doc re: screening colonoscopy  You look great- keep up the good work and let me know if any concerns   Health Maintenance After Age 32 After age 55, you are at a higher risk for certain long-term diseases and infections as well as injuries from falls. Falls are a major cause of broken bones and head injuries in people who are older than age 28. Getting regular preventive care can help to keep you healthy and well. Preventive care includes getting regular testing and making lifestyle changes as recommended by your health care provider. Talk with your health care provider about:  Which screenings and tests you should have. A screening is a test that checks for a disease when you have no symptoms.  A diet and exercise plan that is right for you. What should I know about screenings and tests to prevent falls? Screening and testing are the best ways to find a health problem early. Early diagnosis and treatment give you the best chance of managing medical conditions that are common after age 45. Certain conditions and lifestyle choices may make you more likely to have a fall. Your health care provider may recommend:  Regular vision checks. Poor vision and conditions such as cataracts can make you more likely to have a fall. If you wear glasses, make sure to get your prescription updated if your vision changes.  Medicine review. Work with your health care provider to regularly review all of the medicines you are taking, including over-the-counter medicines. Ask your health care provider about any side effects that may make you more likely to have a fall. Tell your health care  provider if any medicines that you take make you feel dizzy or sleepy.  Osteoporosis screening. Osteoporosis is a condition that causes the bones to get weaker. This can make the bones weak and cause them to break more easily.  Blood pressure screening. Blood pressure changes and medicines to control blood pressure can make you feel dizzy.  Strength and balance checks. Your health care provider may recommend certain tests to check your strength and balance while standing, walking, or changing positions.  Foot health exam. Foot pain and numbness, as well as not wearing proper footwear, can make you more likely to have a fall.  Depression screening. You may be more likely to have a fall if you have a fear of falling, feel emotionally low, or feel unable to do activities that you used to do.  Alcohol use screening. Using too much alcohol can affect your balance and may make you more likely to have a fall. What actions can I take to lower my risk of falls? General instructions  Talk with your health care provider about your risks for falling. Tell your health care provider if: ? You fall. Be sure to tell your health care provider about all falls, even ones that seem minor. ? You feel dizzy, sleepy, or off-balance.  Take over-the-counter and prescription medicines only as told by your health care provider. These include any supplements.  Eat a healthy diet and maintain a healthy weight. A healthy diet includes low-fat  dairy products, low-fat (lean) meats, and fiber from whole grains, beans, and lots of fruits and vegetables. Home safety  Remove any tripping hazards, such as rugs, cords, and clutter.  Install safety equipment such as grab bars in bathrooms and safety rails on stairs.  Keep rooms and walkways well-lit. Activity   Follow a regular exercise program to stay fit. This will help you maintain your balance. Ask your health care provider what types of exercise are appropriate for  you.  If you need a cane or walker, use it as recommended by your health care provider.  Wear supportive shoes that have nonskid soles. Lifestyle  Do not drink alcohol if your health care provider tells you not to drink.  If you drink alcohol, limit how much you have: ? 0-1 drink a day for women. ? 0-2 drinks a day for men.  Be aware of how much alcohol is in your drink. In the [place] one drink equals one typical bottle of beer (12 oz), one-half glass of wine (5 oz), or one shot of hard liquor (1 oz).  Do not use any products that contain nicotine or tobacco, such as cigarettes and e-cigarettes. If you need help quitting, ask your health care provider. Summary  Having a healthy lifestyle and getting preventive care can help to protect your health and wellness after age 39.  Screening and testing are the best way to find a health problem early and help you avoid having a fall. Early diagnosis and treatment give you the best chance for managing medical conditions that are more common for people who are older than age 55.  Falls are a major cause of broken bones and head injuries in people who are older than age 18. Take precautions to prevent a fall at home.  Work with your health care provider to learn what changes you can make to improve your health and wellness and to prevent falls. This information is not intended to replace advice given to you by your health care provider. Make sure you discuss any questions you have with your health care provider. Document Released: 10/21/2017 Document Revised: 03/31/2019 Document Reviewed: 10/21/2017 Elsevier Patient Education  2020 [employer].

## 2019-08-31 ENCOUNTER — Ambulatory Visit (HOSPITAL_BASED_OUTPATIENT_CLINIC_OR_DEPARTMENT_OTHER)
Admission: RE | Admit: 2019-08-31 | Discharge: 2019-08-31 | Disposition: A | Discharge status: Home / Self Care | Payer: Medicare Other | Source: Ambulatory Visit | Attending: Family Medicine | Admitting: Family Medicine

## 2019-08-31 ENCOUNTER — Encounter: Payer: Self-pay | Admitting: Family Medicine

## 2019-08-31 ENCOUNTER — Ambulatory Visit (INDEPENDENT_AMBULATORY_CARE_PROVIDER_SITE_OTHER): Payer: Medicare Other | Admitting: Family Medicine

## 2019-08-31 ENCOUNTER — Other Ambulatory Visit: Payer: Self-pay

## 2019-08-31 VITALS — BP 112/72 | HR 71 | Temp 97.2°F | Resp 16 | Ht 69.5 in | Wt 173.0 lb

## 2019-08-31 DIAGNOSIS — Z1211 Encounter for screening for malignant neoplasm of colon: Secondary | ICD-10-CM

## 2019-08-31 DIAGNOSIS — Z131 Encounter for screening for diabetes mellitus: Secondary | ICD-10-CM

## 2019-08-31 DIAGNOSIS — Z1239 Encounter for other screening for malignant neoplasm of breast: Secondary | ICD-10-CM

## 2019-08-31 DIAGNOSIS — Z1231 Encounter for screening mammogram for malignant neoplasm of breast: Secondary | ICD-10-CM | POA: Diagnosis not present

## 2019-08-31 DIAGNOSIS — E785 Hyperlipidemia, unspecified: Secondary | ICD-10-CM

## 2019-08-31 DIAGNOSIS — I10 Essential (primary) hypertension: Secondary | ICD-10-CM

## 2019-08-31 DIAGNOSIS — M810 Age-related osteoporosis without current pathological fracture: Secondary | ICD-10-CM

## 2019-08-31 DIAGNOSIS — Z0000 Encounter for general adult medical examination without abnormal findings: Secondary | ICD-10-CM

## 2019-08-31 DIAGNOSIS — R7303 Prediabetes: Secondary | ICD-10-CM

## 2019-08-31 LAB — CBC
HCT: 42.2 % (ref 36.0–46.0)
Hemoglobin: 14 g/dL (ref 12.0–15.0)
MCHC: 33.1 g/dL (ref 30.0–36.0)
MCV: 89.5 fl (ref 78.0–100.0)
Platelets: 294 10*3/uL (ref 150.0–400.0)
RBC: 4.72 Mil/uL (ref 3.87–5.11)
RDW: 12.6 % (ref 11.5–15.5)
WBC: 7.7 10*3/uL (ref 4.0–10.5)

## 2019-08-31 LAB — LDL CHOLESTEROL, DIRECT: Direct LDL: 81 mg/dL

## 2019-08-31 LAB — LIPID PANEL
Cholesterol: 166 mg/dL (ref 0–200)
HDL: 38.7 mg/dL — ABNORMAL LOW (ref >39.00)
NonHDL: 127.18
Total CHOL/HDL Ratio: 4
Triglycerides: 304 mg/dL — ABNORMAL HIGH (ref 0.0–149.0)
VLDL: 60.8 mg/dL — ABNORMAL HIGH (ref 0.0–40.0)

## 2019-08-31 LAB — COMPREHENSIVE METABOLIC PANEL
ALT: 17 U/L (ref 0–35)
AST: 19 U/L (ref 0–37)
Albumin: 4.4 g/dL (ref 3.5–5.2)
Alkaline Phosphatase: 52 U/L (ref 39–117)
BUN: 20 mg/dL (ref 6–23)
CO2: 30 mEq/L (ref 19–32)
Calcium: 9.9 mg/dL (ref 8.4–10.5)
Chloride: 101 mEq/L (ref 96–112)
Creatinine, Ser: 0.76 mg/dL (ref 0.40–1.20)
GFR: 73.18 mL/min (ref >60.00)
Glucose, Bld: 93 mg/dL (ref 70–99)
Potassium: 3.5 mEq/L (ref 3.5–5.1)
Sodium: 137 mEq/L (ref 135–145)
Total Bilirubin: 0.5 mg/dL (ref 0.2–1.2)
Total Protein: 7.3 g/dL (ref 6.0–8.3)

## 2019-08-31 LAB — HEMOGLOBIN A1C: Hgb A1c MFr Bld: 5.8 % (ref 4.6–6.5)

## 2019-08-31 MED ORDER — PRAVASTATIN SODIUM 20 MG PO TABS: 20.0000 mg | ORAL_TABLET | Freq: Every day | ORAL | 3 refills | Status: DC

## 2019-08-31 MED ORDER — LOSARTAN POTASSIUM-HCTZ 100-12.5 MG PO TABS: 1.0000 | ORAL_TABLET | Freq: Every day | ORAL | 3 refills | Status: DC

## 2019-08-31 MED ORDER — ALENDRONATE SODIUM 70 MG PO TABS: 70.0000 mg | ORAL_TABLET | ORAL | 3 refills | Status: DC

## 2019-09-07 ENCOUNTER — Encounter: Payer: Self-pay | Admitting: Family Medicine

## 2019-09-08 ENCOUNTER — Telehealth: Payer: Self-pay

## 2019-09-08 MED ORDER — ROSUVASTATIN CALCIUM 10 MG PO TABS: 10.0000 mg | ORAL_TABLET | Freq: Every day | ORAL | 3 refills | Status: DC

## 2019-09-08 NOTE — Telephone Encounter (Signed)
Copied from [place] [id number]. Topic: General - Other >> Sep 08, 2019 11:33 AM Katrina Lucas wrote: Reason for CRM: Pt stated she was told that the pravastatin (PRAVACHOL) 20 MG tablet would be changed and she is ok with that. Pt requests call back to advise her of the new medication

## 2019-09-08 NOTE — Telephone Encounter (Signed)
Changed her over to crestor, called and let her know Plan to visit in 6 months

## 2020-01-04 ENCOUNTER — Telehealth: Payer: Self-pay | Admitting: Family Medicine

## 2020-01-04 DIAGNOSIS — R7303 Prediabetes: Secondary | ICD-10-CM

## 2020-01-04 DIAGNOSIS — I10 Essential (primary) hypertension: Secondary | ICD-10-CM

## 2020-01-04 DIAGNOSIS — E785 Hyperlipidemia, unspecified: Secondary | ICD-10-CM

## 2020-01-04 NOTE — Telephone Encounter (Signed)
Copied from [place] [id number]. Topic: General - Other >> Jan 04, 2020  9:35 AM Keene Breath wrote: Reason for CRM: Patient called to request orders for blood work in March.  Please call patient to let her know when she can schedule an appt. In March.  CB# [phone]

## 2020-01-05 NOTE — Telephone Encounter (Signed)
Please advise on which lab orders to place.

## 2020-01-06 NOTE — Telephone Encounter (Signed)
Left detailed message for patient to schedule lab appt in march

## 2020-03-02 ENCOUNTER — Other Ambulatory Visit: Payer: Medicare Other

## 2020-03-07 ENCOUNTER — Other Ambulatory Visit: Payer: Self-pay

## 2020-03-07 ENCOUNTER — Other Ambulatory Visit (INDEPENDENT_AMBULATORY_CARE_PROVIDER_SITE_OTHER): Payer: Medicare Other

## 2020-03-07 ENCOUNTER — Encounter: Payer: Self-pay | Admitting: Family Medicine

## 2020-03-07 DIAGNOSIS — E785 Hyperlipidemia, unspecified: Secondary | ICD-10-CM

## 2020-03-07 DIAGNOSIS — R7303 Prediabetes: Secondary | ICD-10-CM | POA: Diagnosis not present

## 2020-03-07 DIAGNOSIS — I10 Essential (primary) hypertension: Secondary | ICD-10-CM | POA: Diagnosis not present

## 2020-03-07 LAB — COMPREHENSIVE METABOLIC PANEL
ALT: 16 U/L (ref 0–35)
AST: 18 U/L (ref 0–37)
Albumin: 4.3 g/dL (ref 3.5–5.2)
Alkaline Phosphatase: 52 U/L (ref 39–117)
BUN: 18 mg/dL (ref 6–23)
CO2: 30 mEq/L (ref 19–32)
Calcium: 10 mg/dL (ref 8.4–10.5)
Chloride: 102 mEq/L (ref 96–112)
Creatinine, Ser: 0.81 mg/dL (ref 0.40–1.20)
GFR: 67.91 mL/min (ref >60.00)
Glucose, Bld: 97 mg/dL (ref 70–99)
Potassium: 3.7 mEq/L (ref 3.5–5.1)
Sodium: 138 mEq/L (ref 135–145)
Total Bilirubin: 0.4 mg/dL (ref 0.2–1.2)
Total Protein: 7 g/dL (ref 6.0–8.3)

## 2020-03-07 LAB — LIPID PANEL
Cholesterol: 130 mg/dL (ref 0–200)
HDL: 43.4 mg/dL (ref >39.00)
NonHDL: 86.45
Total CHOL/HDL Ratio: 3
Triglycerides: 271 mg/dL — ABNORMAL HIGH (ref 0.0–149.0)
VLDL: 54.2 mg/dL — ABNORMAL HIGH (ref 0.0–40.0)

## 2020-03-07 LAB — LDL CHOLESTEROL, DIRECT: Direct LDL: 46 mg/dL

## 2020-03-07 LAB — CBC
HCT: 41.2 % (ref 36.0–46.0)
Hemoglobin: 13.9 g/dL (ref 12.0–15.0)
MCHC: 33.7 g/dL (ref 30.0–36.0)
MCV: 88.9 fl (ref 78.0–100.0)
Platelets: 277 10*3/uL (ref 150.0–400.0)
RBC: 4.63 Mil/uL (ref 3.87–5.11)
RDW: 13 % (ref 11.5–15.5)
WBC: 7.3 10*3/uL (ref 4.0–10.5)

## 2020-03-07 LAB — HEMOGLOBIN A1C: Hgb A1c MFr Bld: 5.7 % (ref 4.6–6.5)

## 2020-06-25 ENCOUNTER — Other Ambulatory Visit: Payer: Self-pay | Admitting: Family Medicine

## 2020-08-01 ENCOUNTER — Other Ambulatory Visit: Payer: Self-pay | Admitting: Family Medicine

## 2020-08-01 DIAGNOSIS — M810 Age-related osteoporosis without current pathological fracture: Secondary | ICD-10-CM

## 2020-08-20 ENCOUNTER — Telehealth (INDEPENDENT_AMBULATORY_CARE_PROVIDER_SITE_OTHER): Payer: Medicare Other | Admitting: Family Medicine

## 2020-08-20 ENCOUNTER — Other Ambulatory Visit: Payer: Self-pay

## 2020-08-20 DIAGNOSIS — J0110 Acute frontal sinusitis, unspecified: Secondary | ICD-10-CM

## 2020-08-20 MED ORDER — AMOXICILLIN 500 MG PO CAPS: 1000.0000 mg | ORAL_CAPSULE | Freq: Two times a day (BID) | ORAL | 0 refills | Status: DC

## 2020-08-20 NOTE — Progress Notes (Signed)
[place] at [facility] [address] [place], [place] [address] [phone]  Date:  08/20/2020   Name:  Katrina Lucas   DOB:  1941-04-02   MRN:  [id number]  PCP:  Darreld Mclean, MD    Chief Complaint: No chief complaint on file.   History of Present Illness:  Katrina Lucas is a 79 y.o. very pleasant female patient who presents with the following: Virtual visit today for a possible sinus infection-  Patient location is home, provider location is office.  Patient identity is confirmed with 2 factors, she gives consent for virtual visit today The patient myself are present on the call She has noted pain in her right frontal sinuses for about 4 days-this reminds her of previous sinus infections Using heat and tylenol No fever noted- she has not checked her temps but has noted any apparent fever symptoms Mild cough  She has not felt terribly ill- just pain in her sinuses Her teeth do not hurt that she can tell  No vomiting or diarrhea She finished her covid vaccines 2/11  Patient Active Problem List   Diagnosis Date Noted  . Osteoporosis 08/31/2019  . Pre-diabetes 08/31/2019  . Essential hypertension 08/25/2018  . Peripheral polyneuropathy 08/25/2018  . Dyslipidemia 08/25/2018  . Bilateral hearing loss 08/25/2018    Past Medical History:  Diagnosis Date  . Allergy   . Arthritis   . Chicken pox   . Colon polyp   . Diverticulitis   . GERD (gastroesophageal reflux disease)   . Heart murmur   . Hyperlipidemia   . Hypertension     No past surgical history on file.  Social History   Tobacco Use  . Smoking status: Never Smoker  . Smokeless tobacco: Never Used  Substance Use Topics  . Alcohol use: Yes    Comment: 5 times a week  . Drug use: Never    No family history on file.  Allergies  Allergen Reactions  . Sulfa Antibiotics Shortness Of Breath  . Aspirin     Microscopic  bleeding Microscopic bleeding   . Codeine Nausea And Vomiting    Medication list has been reviewed and updated.  Current Outpatient Medications on File Prior to Visit  Medication Sig Dispense Refill  . acetaminophen (TYLENOL) 325 MG tablet Take 650 mg by mouth.     Marland Kitchen alendronate (FOSAMAX) 70 MG tablet TAKE 1 TABLET EVERY 7 DAYS WITH A FULL GLASS OF WATER ON AN EMPTY STOMACH 12 tablet 3  . cetirizine (ZYRTEC) 10 MG tablet Take 10 mg by mouth daily.    . DOCOSAHEXAENOIC ACID PO Take by mouth.    Marland Kitchen LORazepam (ATIVAN) 0.5 MG tablet Take 0.5 mg by mouth as needed (dizziness).    Marland Kitchen losartan-hydrochlorothiazide (HYZAAR) 100-12.5 MG tablet Take 1 tablet by mouth daily. 90 tablet 3  . Multiple Vitamins-Minerals (CENTRUM SILVER 50+WOMEN PO) Take by mouth.    . rosuvastatin (CRESTOR) 10 MG tablet Take 1 tablet (10 mg total) by mouth daily. 90 tablet 0  . UNABLE TO FIND Med Name: Align GI one capsule daily    . vitamin B-12 (CYANOCOBALAMIN) 1000 MCG tablet Take by mouth.     No current facility-administered medications on file prior to visit.    Review of Systems:  As per HPI- otherwise negative.   Physical Examination: There were no vitals filed for this visit. There were no vitals filed for this visit. There  is no height or weight on file to calculate BMI. Ideal Body Weight:    Connected with patient via video monitor.  She looks well and her normal self Pt notes her BP has been mildly elevated recently, she plans to discuss this with me and bring in some readings at her upcoming appointment in the office  Assessment and Plan: Acute non-recurrent frontal sinusitis - Plan: amoxicillin (AMOXIL) 500 MG capsule   Virtual visit today for acute frontal sinusitis Video used for the entirety of visit today Patient with concern of symptoms likely due to a sinus infection.  We will start her on amoxicillin I also encouraged her to be tested for COVID-19 today, if she test positive she will  let me know and I can help arrange monoclonal antibody Otherwise, she is asked to inform me if not feeling better in the next few days, sooner if worse We have an in office appointment planned in the next few weeks for further follow-up Signed Lamar Blinks, MD

## 2020-09-10 ENCOUNTER — Encounter: Payer: Medicare Other | Admitting: Family Medicine

## 2020-09-13 ENCOUNTER — Other Ambulatory Visit: Payer: Self-pay | Admitting: Family Medicine

## 2020-09-13 DIAGNOSIS — I10 Essential (primary) hypertension: Secondary | ICD-10-CM

## 2020-09-14 NOTE — Patient Instructions (Addendum)
Good to see you again today!    I will be in touch your labs as soon as possible. I sent a ref[facility]epam [facility], also some ondansetron that you can use as needed for vomiting if you have vertigo  Also, you can add 2.5 mg of amlodipine on days when your systolic blood pressures greater than 140 If you end up using this every day, I can send a 90-day supply to your mail away pharmacy  I ordered a bone density scan for you, and also a CT of the abdomen and pelvis to rule out any mass or other concerning finding  Take care, please see me in about 6 months   Health Maintenance After Age 68 After age 41, you are at a higher risk for certain long-term diseases and infections as well as injuries from falls. Falls are a major cause of broken bones and head injuries in people who are older than age 35. Getting regular preventive care can help to keep you healthy and well. Preventive care includes getting regular testing and making lifestyle changes as recommended by your health care provider. Talk with your health care provider about:  Which screenings and tests you should have. A screening is a test that checks for a disease when you have no symptoms.  A diet and exercise plan that is right for you. What should I know about screenings and tests to prevent falls? Screening and testing are the best ways to find a health problem early. Early diagnosis and treatment give you the best chance of managing medical conditions that are common after age 41. Certain conditions and lifestyle choices may make you more likely to have a fall. Your health care provider may recommend:  Regular vision checks. Poor vision and conditions such as cataracts can make you more likely to have a fall. If you wear glasses, make sure to get your prescription updated if your vision changes.  Medicine review. Work with your health care provider to regularly review all of the medicines you are taking,  including over-the-counter medicines. Ask your health care provider about any side effects that may make you more likely to have a fall. Tell your health care provider if any medicines that you take make you feel dizzy or sleepy.  Osteoporosis screening. Osteoporosis is a condition that causes the bones to get weaker. This can make the bones weak and cause them to break more easily.  Blood pressure screening. Blood pressure changes and medicines to control blood pressure can make you feel dizzy.  Strength and balance checks. Your health care provider may recommend certain tests to check your strength and balance while standing, walking, or changing positions.  Foot health exam. Foot pain and numbness, as well as not wearing proper footwear, can make you more likely to have a fall.  Depression screening. You may be more likely to have a fall if you have a fear of falling, feel emotionally low, or feel unable to do activities that you used to do.  Alcohol use screening. Using too much alcohol can affect your balance and may make you more likely to have a fall. What actions can I take to lower my risk of falls? General instructions  Talk with your health care provider about your risks for falling. Tell your health care provider if: ? You fall. Be sure to tell your health care provider about all falls, even ones that seem minor. ? You feel dizzy, sleepy, or  off-balance.  Take over-the-counter and prescription medicines only as told by your health care provider. These include any supplements.  Eat a healthy diet and maintain a healthy weight. A healthy diet includes low-fat dairy products, low-fat (lean) meats, and fiber from whole grains, beans, and lots of fruits and vegetables. Home safety  Remove any tripping hazards, such as rugs, cords, and clutter.  Install safety equipment such as grab bars in bathrooms and safety rails on stairs.  Keep rooms and walkways  well-lit. Activity   Follow a regular exercise program to stay fit. This will help you maintain your balance. Ask your health care provider what types of exercise are appropriate for you.  If you need a cane or walker, use it as recommended by your health care provider.  Wear supportive shoes that have nonskid soles. Lifestyle  Do not drink alcohol if your health care provider tells you not to drink.  If you drink alcohol, limit how much you have: ? 0-1 drink a day for women. ? 0-2 drinks a day for men.  Be aware of how much alcohol is in your drink. In the [place] one drink equals one typical bottle of beer (12 oz), one-half glass of wine (5 oz), or one shot of hard liquor (1 oz).  Do not use any products that contain nicotine or tobacco, such as cigarettes and e-cigarettes. If you need help quitting, ask your health care provider. Summary  Having a healthy lifestyle and getting preventive care can help to protect your health and wellness after age 23.  Screening and testing are the best way to find a health problem early and help you avoid having a fall. Early diagnosis and treatment give you the best chance for managing medical conditions that are more common for people who are older than age 99.  Falls are a major cause of broken bones and head injuries in people who are older than age 33. Take precautions to prevent a fall at home.  Work with your health care provider to learn what changes you can make to improve your health and wellness and to prevent falls. This information is not intended to replace advice given to you by your health care provider. Make sure you discuss any questions you have with your health care provider. Document Revised: 03/31/2019 Document Reviewed: 10/21/2017 Elsevier Patient Education  2020 [employer].

## 2020-09-14 NOTE — Progress Notes (Addendum)
[place] at [facility] [address] [place], [place] [address] [phone]  Date:  09/17/2020   Name:  Katrina Lucas   DOB:  May 17, 1941   MRN:  [id number]  PCP:  Darreld Mclean, MD    Chief Complaint: Annual Exam   History of Present Illness:  Katrina Lucas is a 79 y.o. very pleasant female patient who presents with the following:  Pt here today for a CPE- History of HTN, hyperlipidemia, polyneuropathy, prediabetes  Last seen by myself just recently for sinus infection-symptoms resolved with antibiotics  She is a retired [employer] scientist, enjoys golf.  She played 18 holes over the weekend!  covid series- done including booster  Flu vaccine- done Due for bone density this year Mammogram- will do next year  Most recent labs March of this year  Colon done in June- Dr Shana Chute with [facility]   [name] notes that her belly has seemed somewhat distended for about 1 year.  She is not having pain, but she does have more gas and bloating than is normal.  She has been able to lose weight when she tries, but her belly does not change in size She has not had a hysterectomy  About once a year [name] will have a debilitating episode of what sounds like BPPV.  She will wake up in the morning with severe dizziness, often accompanied by vomiting.  She uses lorazepam as needed for this  Blood pressure treated with losartan/HCTZ.  Have brings in blood pressure readings over the last several months.  Sometimes well controlled, though her systolic blood pressure is often between 140 and 155  crestor Losartan/hctz  She gets some of her care from the [place]- they recently did some hearing aids for her that are not working that well -she is having more difficulty hearing, she plans to go back and have these rechecked Patient Active Problem List   Diagnosis Date Noted  . Osteoporosis 08/31/2019  . Pre-diabetes 08/31/2019  . Essential hypertension 08/25/2018   . Peripheral polyneuropathy 08/25/2018  . Dyslipidemia 08/25/2018  . Bilateral hearing loss 08/25/2018    Past Medical History:  Diagnosis Date  . Allergy   . Arthritis   . Chicken pox   . Colon polyp   . Diverticulitis   . GERD (gastroesophageal reflux disease)   . Heart murmur   . Hyperlipidemia   . Hypertension     History reviewed. No pertinent surgical history.  Social History   Tobacco Use  . Smoking status: Never Smoker  . Smokeless tobacco: Never Used  Substance Use Topics  . Alcohol use: Yes    Comment: 5 times a week  . Drug use: Never    History reviewed. No pertinent family history.  Allergies  Allergen Reactions  . Sulfa Antibiotics Shortness Of Breath  . Aspirin     Microscopic bleeding Microscopic bleeding   . Codeine Nausea And Vomiting    Medication list has been reviewed and updated.  Current Outpatient Medications on File Prior to Visit  Medication Sig Dispense Refill  . acetaminophen (TYLENOL) 325 MG tablet Take 650 mg by mouth.     Marland Kitchen alendronate (FOSAMAX) 70 MG tablet TAKE 1 TABLET EVERY 7 DAYS WITH A FULL GLASS OF WATER ON AN EMPTY STOMACH 12 tablet 3  . amoxicillin (AMOXIL) 500 MG capsule Take 2 capsules (1,000 mg total) by mouth 2 (two) times daily. 40 capsule 0  . cetirizine (  ZYRTEC) 10 MG tablet Take 10 mg by mouth daily.    . DOCOSAHEXAENOIC ACID PO Take by mouth.    Marland Kitchen LORazepam (ATIVAN) 0.5 MG tablet Take 0.5 mg by mouth as needed (dizziness).    Marland Kitchen losartan-hydrochlorothiazide (HYZAAR) 100-12.5 MG tablet TAKE 1 TABLET DAILY 90 tablet 3  . Multiple Vitamins-Minerals (CENTRUM SILVER 50+WOMEN PO) Take by mouth.    . rosuvastatin (CRESTOR) 10 MG tablet Take 1 tablet (10 mg total) by mouth daily. 90 tablet 0  . UNABLE TO FIND Med Name: Align GI one capsule daily    . vitamin B-12 (CYANOCOBALAMIN) 1000 MCG tablet Take by mouth.     No current facility-administered medications on file prior to visit.    Review of Systems:  As per  HPI- otherwise negative.   Physical Examination: Vitals:   09/17/20 1035  BP: (!) 154/84  Pulse: 80  Resp: 16  SpO2: 96%   Vitals:   09/17/20 1035  Weight: 172 lb (78 kg)  Height: 5' 9.5" (1.765 m)   Body mass index is 25.04 kg/m. Ideal Body Weight: Weight in (lb) to have BMI = 25: 171.4  GEN: no acute distress.  Normal weight, looks well, hard of hearing HEENT: Atraumatic, Normocephalic.  Ears and Nose: No external deformity. CV: RRR, No M/G/R. No JVD. No thrill. No extra heart sounds. PULM: CTA B, no wheezes, crackles, rhonchi. No retractions. No resp. distress. No accessory muscle use. ABD: S, +BS. No rebound. No HSM.  Belly is soft yet enlarged/distended.  Nontender EXTR: No c/c/e PSYCH: Normally interactive. Conversant.    Assessment and Plan: Vertigo - Plan: LORazepam (ATIVAN) 0.5 MG tablet, ondansetron (ZOFRAN) 8 MG tablet  Essential hypertension - Plan: amLODipine (NORVASC) 2.5 MG tablet, CBC  Pre-diabetes - Plan: Comprehensive metabolic panel, Hemoglobin A1c  Peripheral polyneuropathy  Age related osteoporosis, unspecified pathological fracture presence - Plan: DG Bone Density  Abdominal distention - Plan: CT Abdomen Pelvis W Contrast  Abdominal distension (gaseous) - Plan: CT Abdomen Pelvis W Contrast  79 year old woman, generally in very good health for age Labs are pending as above She has noticed some abdominal distention for about 1 year.  Will check labs, ordered CT abdomen pelvis to rule out mass Due for bone density, this is ordered She prefers to do mammogram every other year Occasional episodes of vertigo-sounds typical of BPPV.  Refill lorazepam to use as needed, gave Zofran to use when needed for nausea and vomiting Blood pressure is slightly elevated.  Continue losartan/HCTZ, prescribed amlodipine 2.5 that she can take when systolic pressures greater than 140 Plan to visit in 6 months assuming all is well Will plan further follow- up pending  labs.  This visit occurred during the SARS-CoV-2 public health emergency.  Safety protocols were in place, including screening questions prior to the visit, additional usage of staff PPE, and extensive cleaning of exam room while observing appropriate contact time as indicated for disinfecting solutions.    Signed Lamar Blinks, MD  Addendum 9/28, received her labs as below-message to patient  Results for orders placed or performed in visit on 09/17/20  CBC  Result Value Ref Range   WBC 7.0 3.8 - 10.8 Thousand/uL   RBC 4.69 3.80 - 5.10 Million/uL   Hemoglobin 14.0 11.7 - 15.5 g/dL   HCT 41.9 35 - 45 %   MCV 89.3 80.0 - 100.0 fL   MCH 29.9 27.0 - 33.0 pg   MCHC 33.4 32.0 - 36.0 g/dL   RDW 12.1  11.0 - 15.0 %   Platelets 263 140 - 400 Thousand/uL   MPV 9.1 7.5 - 12.5 fL  Comprehensive metabolic panel  Result Value Ref Range   Glucose, Bld 100 (H) 65 - 99 mg/dL   BUN 19 7 - 25 mg/dL   Creat 0.80 0.60 - 0.88 mg/dL   BUN/Creatinine Ratio NOT APPLICABLE 6 - 22 (calc)   Sodium 139 135 - 146 mmol/L   Potassium 4.3 3.5 - 5.3 mmol/L   Chloride 103 98 - 110 mmol/L   CO2 27 20 - 32 mmol/L   Calcium 10.1 8.6 - 10.4 mg/dL   Total Protein 7.1 6.1 - 8.1 g/dL   Albumin 4.6 3.6 - 5.1 g/dL   Globulin 2.5 1.9 - 3.7 g/dL (calc)   AG Ratio 1.8 1.0 - 2.5 (calc)   Total Bilirubin 0.7 0.2 - 1.2 mg/dL   Alkaline phosphatase (APISO) 48 37 - 153 U/L   AST 20 10 - 35 U/L   ALT 15 6 - 29 U/L  Hemoglobin A1c  Result Value Ref Range   Hgb A1c MFr Bld 5.6 <5.7 % of total Hgb   Mean Plasma Glucose 114 (calc)   eAG (mmol/L) 6.3 (calc)

## 2020-09-17 ENCOUNTER — Ambulatory Visit (INDEPENDENT_AMBULATORY_CARE_PROVIDER_SITE_OTHER): Payer: Medicare Other | Admitting: Family Medicine

## 2020-09-17 ENCOUNTER — Other Ambulatory Visit: Payer: Self-pay

## 2020-09-17 ENCOUNTER — Encounter: Payer: Self-pay | Admitting: Family Medicine

## 2020-09-17 VITALS — BP 154/84 | HR 80 | Resp 16 | Ht 69.5 in | Wt 172.0 lb

## 2020-09-17 DIAGNOSIS — R7303 Prediabetes: Secondary | ICD-10-CM | POA: Diagnosis not present

## 2020-09-17 DIAGNOSIS — R42 Dizziness and giddiness: Secondary | ICD-10-CM

## 2020-09-17 DIAGNOSIS — I10 Essential (primary) hypertension: Secondary | ICD-10-CM | POA: Diagnosis not present

## 2020-09-17 DIAGNOSIS — M810 Age-related osteoporosis without current pathological fracture: Secondary | ICD-10-CM

## 2020-09-17 DIAGNOSIS — R140 Abdominal distension (gaseous): Secondary | ICD-10-CM

## 2020-09-17 DIAGNOSIS — G629 Polyneuropathy, unspecified: Secondary | ICD-10-CM | POA: Diagnosis not present

## 2020-09-17 MED ORDER — AMLODIPINE BESYLATE 2.5 MG PO TABS: 2.5000 mg | ORAL_TABLET | Freq: Every day | ORAL | 3 refills | Status: DC

## 2020-09-17 MED ORDER — LORAZEPAM 0.5 MG PO TABS: 0.5000 mg | ORAL_TABLET | ORAL | 0 refills | Status: DC | PRN

## 2020-09-17 MED ORDER — ONDANSETRON HCL 8 MG PO TABS: 8.0000 mg | ORAL_TABLET | Freq: Three times a day (TID) | ORAL | 0 refills | Status: DC | PRN

## 2020-09-17 MED FILL — ONDANSETRON HCL 8 MG TABLET: 8 | 5 days supply | Qty: 15 | Fill #0

## 2020-09-17 MED FILL — AMLODIPINE 2.5 MG TABLET: 2.5 | 30 days supply | Qty: 30 | Fill #0

## 2020-09-17 MED FILL — LORazepam 0.5 MG TABS: 0.5 | 30 days supply | Qty: 30 | Fill #0

## 2020-09-18 ENCOUNTER — Encounter: Payer: Self-pay | Admitting: Family Medicine

## 2020-09-18 LAB — HEMOGLOBIN A1C
Hgb A1c MFr Bld: 5.6 % of total Hgb (ref <5.7)
Mean Plasma Glucose: 114 (calc)
eAG (mmol/L): 6.3 (calc)

## 2020-09-18 LAB — COMPREHENSIVE METABOLIC PANEL
AG Ratio: 1.8 (calc) (ref 1.0–2.5)
ALT: 15 U/L (ref 6–29)
AST: 20 U/L (ref 10–35)
Albumin: 4.6 g/dL (ref 3.6–5.1)
Alkaline phosphatase (APISO): 48 U/L (ref 37–153)
BUN: 19 mg/dL (ref 7–25)
CO2: 27 mmol/L (ref 20–32)
Calcium: 10.1 mg/dL (ref 8.6–10.4)
Chloride: 103 mmol/L (ref 98–110)
Creat: 0.8 mg/dL (ref 0.60–0.88)
Globulin: 2.5 g/dL (calc) (ref 1.9–3.7)
Glucose, Bld: 100 mg/dL — ABNORMAL HIGH (ref 65–99)
Potassium: 4.3 mmol/L (ref 3.5–5.3)
Sodium: 139 mmol/L (ref 135–146)
Total Bilirubin: 0.7 mg/dL (ref 0.2–1.2)
Total Protein: 7.1 g/dL (ref 6.1–8.1)

## 2020-09-18 LAB — CBC
HCT: 41.9 % (ref 35.0–45.0)
Hemoglobin: 14 g/dL (ref 11.7–15.5)
MCH: 29.9 pg (ref 27.0–33.0)
MCHC: 33.4 g/dL (ref 32.0–36.0)
MCV: 89.3 fL (ref 80.0–100.0)
MPV: 9.1 fL (ref 7.5–12.5)
Platelets: 263 10*3/uL (ref 140–400)
RBC: 4.69 10*6/uL (ref 3.80–5.10)
RDW: 12.1 % (ref 11.0–15.0)
WBC: 7 10*3/uL (ref 3.8–10.8)

## 2020-09-25 ENCOUNTER — Ambulatory Visit (HOSPITAL_BASED_OUTPATIENT_CLINIC_OR_DEPARTMENT_OTHER)
Admission: RE | Admit: 2020-09-25 | Discharge: 2020-09-25 | Disposition: A | Discharge status: Home / Self Care | Payer: Medicare Other | Source: Ambulatory Visit | Attending: Family Medicine | Admitting: Family Medicine

## 2020-09-25 ENCOUNTER — Other Ambulatory Visit: Payer: Self-pay

## 2020-09-25 ENCOUNTER — Other Ambulatory Visit: Payer: Self-pay | Admitting: Family Medicine

## 2020-09-25 DIAGNOSIS — M810 Age-related osteoporosis without current pathological fracture: Secondary | ICD-10-CM

## 2020-10-01 ENCOUNTER — Other Ambulatory Visit: Payer: Self-pay

## 2020-10-01 ENCOUNTER — Ambulatory Visit (HOSPITAL_BASED_OUTPATIENT_CLINIC_OR_DEPARTMENT_OTHER)
Admission: RE | Admit: 2020-10-01 | Discharge: 2020-10-01 | Disposition: A | Discharge status: Home / Self Care | Payer: Medicare Other | Source: Ambulatory Visit | Attending: Family Medicine | Admitting: Family Medicine

## 2020-10-01 ENCOUNTER — Encounter (HOSPITAL_BASED_OUTPATIENT_CLINIC_OR_DEPARTMENT_OTHER): Payer: Self-pay

## 2020-10-01 ENCOUNTER — Encounter: Payer: Self-pay | Admitting: Family Medicine

## 2020-10-01 DIAGNOSIS — D259 Leiomyoma of uterus, unspecified: Secondary | ICD-10-CM | POA: Insufficient documentation

## 2020-10-01 DIAGNOSIS — K5730 Diverticulosis of large intestine without perforation or abscess without bleeding: Secondary | ICD-10-CM | POA: Insufficient documentation

## 2020-10-01 DIAGNOSIS — R140 Abdominal distension (gaseous): Secondary | ICD-10-CM | POA: Diagnosis not present

## 2020-10-01 DIAGNOSIS — I10 Essential (primary) hypertension: Secondary | ICD-10-CM

## 2020-10-01 DIAGNOSIS — I700 Atherosclerosis of aorta: Secondary | ICD-10-CM | POA: Diagnosis not present

## 2020-10-01 DIAGNOSIS — K449 Diaphragmatic hernia without obstruction or gangrene: Secondary | ICD-10-CM | POA: Insufficient documentation

## 2020-10-01 MED ORDER — IOHEXOL 300 MG/ML  SOLN
100.0000 mL | Freq: Once | INTRAMUSCULAR | Status: AC | PRN
  Administered 2020-10-01: 100 mL via INTRAVENOUS

## 2020-10-02 ENCOUNTER — Encounter: Payer: Self-pay | Admitting: Family Medicine

## 2020-10-02 MED ORDER — AMLODIPINE BESYLATE 2.5 MG PO TABS: 2.5000 mg | ORAL_TABLET | Freq: Every day | ORAL | 2 refills | Status: DC

## 2020-10-08 ENCOUNTER — Ambulatory Visit (HOSPITAL_BASED_OUTPATIENT_CLINIC_OR_DEPARTMENT_OTHER)
Admission: RE | Admit: 2020-10-08 | Discharge: 2020-10-08 | Disposition: A | Discharge status: Home / Self Care | Payer: Medicare Other | Source: Ambulatory Visit | Attending: Family Medicine | Admitting: Family Medicine

## 2020-10-08 ENCOUNTER — Encounter: Payer: Self-pay | Admitting: Family Medicine

## 2020-10-08 ENCOUNTER — Other Ambulatory Visit: Payer: Self-pay

## 2020-10-08 DIAGNOSIS — M810 Age-related osteoporosis without current pathological fracture: Secondary | ICD-10-CM | POA: Insufficient documentation

## 2020-11-19 ENCOUNTER — Telehealth (INDEPENDENT_AMBULATORY_CARE_PROVIDER_SITE_OTHER): Payer: Medicare Other | Admitting: Internal Medicine

## 2020-11-19 ENCOUNTER — Other Ambulatory Visit: Payer: Self-pay

## 2020-11-19 VITALS — BP 139/72 | Ht 69.5 in | Wt 170.0 lb

## 2020-11-19 DIAGNOSIS — J019 Acute sinusitis, unspecified: Secondary | ICD-10-CM

## 2020-11-19 MED ORDER — AZITHROMYCIN 250 MG PO TABS: ORAL_TABLET | ORAL | 0 refills | Status: DC

## 2020-11-19 NOTE — Progress Notes (Signed)
Subjective:    Patient ID: Katrina Lucas, female    DOB: 20-May-1941, 79 y.o.   MRN: [id number]  DOS:  11/19/2020 Type of visit - description: Virtual Visit via Telephone    I connected with above mentioned patient  by telephone and verified that I am speaking with the correct person using two identifiers.  THIS ENCOUNTER IS A VIRTUAL VISIT DUE TO COVID-19 - PATIENT WAS NOT SEEN IN THE OFFICE. PATIENT HAS CONSENTED TO VIRTUAL VISIT / TELEMEDICINE VISIT   Location of patient: home  Location of provider: office  Persons participating in the virtual visit: patient, provider   I discussed the limitations, risks, security and privacy concerns of performing an evaluation and management service by telephone and the availability of in person appointments. I also discussed with the patient that there may be a patient responsible charge related to this service. The patient expressed understanding and agreed to proceed.  Acute For the last 3 days she is having moderate to severe right maxillary area pain, she believes she has sinusitis, she is a retired Marine scientist. The skin on that area seems normal, she denies any vision problems. She admits to some nasal discharge, clear in color.     Review of Systems Denies fever chills No nausea vomiting or myalgias. No sore throat Admits to mild itchy eyes and nose. Occasional cough, that is going on for years and is not more than usual.  Past Medical History:  Diagnosis Date  . Allergy   . Arthritis   . Chicken pox   . Colon polyp   . Diverticulitis   . GERD (gastroesophageal reflux disease)   . Heart murmur   . Hyperlipidemia   . Hypertension     No past surgical history on file.  Allergies as of 11/19/2020      Reactions   Sulfa Antibiotics Shortness Of Breath   Aspirin    Microscopic bleeding Microscopic bleeding   Codeine Nausea And Vomiting      Medication List       Accurate as of November 19, 2020  4:39 PM. If you  have any questions, ask your nurse or doctor.        STOP taking these medications   amoxicillin 500 MG capsule Commonly known as: AMOXIL Stopped by: Kathlene November, MD     TAKE these medications   acetaminophen 325 MG tablet Commonly known as: TYLENOL Take 650 mg by mouth.   alendronate 70 MG tablet Commonly known as: FOSAMAX TAKE 1 TABLET EVERY 7 DAYS WITH A FULL GLASS OF WATER ON AN EMPTY STOMACH   amLODipine 2.5 MG tablet Commonly known as: NORVASC Take 1 tablet (2.5 mg total) by mouth daily. Take as needed if SBP over 140   CENTRUM SILVER 50+WOMEN PO Take by mouth.   cetirizine 10 MG tablet Commonly known as: ZYRTEC Take 10 mg by mouth daily.   DOCOSAHEXAENOIC ACID PO Take by mouth.   LORazepam 0.5 MG tablet Commonly known as: ATIVAN Take 1 tablet (0.5 mg total) by mouth as needed (dizziness).   losartan-hydrochlorothiazide 100-12.5 MG tablet Commonly known as: HYZAAR TAKE 1 TABLET DAILY   ondansetron 8 MG tablet Commonly known as: ZOFRAN Take 1 tablet (8 mg total) by mouth every 8 (eight) hours as needed for nausea or vomiting.   rosuvastatin 10 MG tablet Commonly known as: CRESTOR TAKE 1 TABLET DAILY   UNABLE TO FIND Med Name: Align GI one capsule daily   vitamin B-12 1000 MCG  tablet Commonly known as: CYANOCOBALAMIN Take by mouth.          Objective:   Physical Exam BP 139/72 (BP Location: Left Arm, Patient Position: Sitting, Cuff Size: Large)   Ht 5' 9.5" (1.765 m)   Wt 170 lb (77.1 kg)   SpO2 (!) 71%   BMI 24.74 kg/m  This is a telephone visit, she sounded well, speaking in complete sentences, alert oriented x3    Assessment    79 year old female, PMH includes HTN, HOH, neuropathy,Prediabetes with the last A1c of 5.6, high cholesterol, presents with:  Sinusitis: We attempted to do a video visit but we could not communicate so this is a telephone assessment. Chief symptom is right maxillary area pain, consistent with previous history of  sinusitis, patient request antibiotics. She had her COVID vaccines including a booster. She is aware that the information I have is very limited but I agreed to send Zithromax, I also recommend to start Flonase perhaps Mucinex. Recommend to be reevaluated quickly she is not improving the next few days. She verbalized understanding.     I discussed the assessment and treatment plan with the patient. The patient was provided an opportunity to ask questions and all were answered. The patient agreed with the plan and demonstrated an understanding of the instructions.   The patient was advised to call back or seek an in-person evaluation if the symptoms worsen or if the condition fails to improve as anticipated.  I provided 18 minutes of non-face-to-face time during this encounter.  Kathlene November, MD

## 2020-12-07 ENCOUNTER — Other Ambulatory Visit: Payer: Self-pay | Admitting: Family Medicine

## 2021-02-17 ENCOUNTER — Other Ambulatory Visit: Payer: Self-pay | Admitting: Family Medicine

## 2021-03-09 NOTE — Progress Notes (Addendum)
[facility] [address], [place], [place] [address] [phone]  Date:  03/11/2021   Name:  Katrina Lucas   DOB:  Jul 06, 1941   MRN:  [id number]  PCP:  Darreld Mclean, MD    Chief Complaint: Hypertension (6 month follow up/) and Fall (Fall in January lower back pain, trouble ambulating. )   History of Present Illness:  Katrina Lucas is a 80 y.o. very pleasant female patient who presents with the following:  Following up today Last seen by myself 6 months ago for her CPE- History of HTN, hyperlipidemia, polyneuropathy, prediabetes, occasional vertigo/ BPPV for which she may use lorazepam Retired Marine scientist, enjoys golfing She gets some care from the [place] At our last visit she had concern of abd distention- labs and CT scan were ok  We also added amlodipine 2.5 mg to use as needed when BP elevated CT scan 10/01/20: IMPRESSION: No acute findings within the abdomen or pelvis. Moderate hiatal hernia. Colonic diverticulosis, without radiographic evidence of diverticulitis. 1 cm calcified uterine fibroid. Aortic Atherosclerosis (ICD10-I70.0)  Tetanus booster-will update today Shingirx- she declines,she did zostavax  dexa UTD mammo 08/2019- she does every 2 years  Colon: she reports colonoscopy last summer, [facility]. 5 year follow-up   Fosamax amlodipine prn Ativan prn hyzaar crestor B12 po  She did fall on the ice over the winter- she went down on her knees and had some dificulty getting up She is having more trouble with her back since this fall The pain mostly stays in her back -lumbar distribution Not radiating down her legs as far as she can tell- but she does have chronic bilateral knee pain No numbness or weakens of her legs except for her chronic foot numbness which is unchanged She did a 45 minute walk over the weekend and had a lot of back pain afterwards She has been using aleve daily since  January and an extra tylenol She tends to take a Tylenol in the morning, then may take an Aleve and 2 more Tylenol throughout the day Patient Active Problem List   Diagnosis Date Noted  . Osteoporosis 08/31/2019  . Pre-diabetes 08/31/2019  . Essential hypertension 08/25/2018  . Peripheral polyneuropathy 08/25/2018  . Dyslipidemia 08/25/2018  . Bilateral hearing loss 08/25/2018    Past Medical History:  Diagnosis Date  . Allergy   . Arthritis   . Chicken pox   . Colon polyp   . Diverticulitis   . GERD (gastroesophageal reflux disease)   . Heart murmur   . Hyperlipidemia   . Hypertension     No past surgical history on file.  Social History   Tobacco Use  . Smoking status: Never Smoker  . Smokeless tobacco: Never Used  Substance Use Topics  . Alcohol use: Yes    Comment: 5 times a week  . Drug use: Never    No family history on file.  Allergies  Allergen Reactions  . Sulfa Antibiotics Shortness Of Breath  . Aspirin     Microscopic bleeding Microscopic bleeding   . Codeine Nausea And Vomiting    Medication list has been reviewed and updated.  Current Outpatient Medications on File Prior to Visit  Medication Sig Dispense Refill  . acetaminophen (TYLENOL) 325 MG tablet Take 650 mg by mouth.     Marland Kitchen acetaminophen (TYLENOL) 325 MG tablet TAKE  BY MOUTH AS DIRECTED BY YOUR MEDICAL PROVIDER AS NEEDED    .  alendronate (FOSAMAX) 70 MG tablet TAKE 1 TABLET EVERY 7 DAYS WITH A FULL GLASS OF WATER ON AN EMPTY STOMACH 12 tablet 3  . amLODipine (NORVASC) 2.5 MG tablet Take 1 tablet (2.5 mg total) by mouth daily. Take as needed if SBP over 140 90 tablet 2  . cetirizine (ZYRTEC) 10 MG tablet Take 10 mg by mouth daily.    . DOCOSAHEXAENOIC ACID PO Take by mouth.    Marland Kitchen LORazepam (ATIVAN) 0.5 MG tablet Take 1 tablet (0.5 mg total) by mouth as needed (dizziness). 30 tablet 0  . losartan-hydrochlorothiazide (HYZAAR) 100-12.5 MG tablet TAKE 1 TABLET DAILY 90 tablet 3  . Multiple  Vitamins-Minerals (CENTRUM SILVER 50+WOMEN PO) Take by mouth.    . naproxen sodium (ALEVE) 220 MG tablet Take 220 mg by mouth.    . ondansetron (ZOFRAN) 8 MG tablet Take 1 tablet (8 mg total) by mouth every 8 (eight) hours as needed for nausea or vomiting. 15 tablet 0  . rosuvastatin (CRESTOR) 10 MG tablet TAKE 1 TABLET DAILY 30 tablet 0  . UNABLE TO FIND Med Name: Align GI one capsule daily    . vitamin B-12 (CYANOCOBALAMIN) 1000 MCG tablet Take by mouth.     No current facility-administered medications on file prior to visit.    Review of Systems:  As per HPI- otherwise negative.   Physical Examination: Vitals:   03/11/21 0833  BP: 126/72  Pulse: 80  Resp: 17  Temp: 98 F (36.7 C)  SpO2: 98%   Vitals:   03/11/21 0833  Weight: 177 lb (80.3 kg)  Height: 5' 9.5" (1.765 m)   Body mass index is 25.76 kg/m. Ideal Body Weight: Weight in (lb) to have BMI = 25: 171.4   GEN: no acute distress.  Minimal overweight, looks well and robust for age 35: Atraumatic, Normocephalic.  Ears and Nose: No external deformity. CV: RRR, No M/G/R. No JVD. No thrill. No extra heart sounds. PULM: CTA B, no wheezes, crackles, rhonchi. No retractions. No resp. distress. No accessory muscle use. ABD: S, NT, ND, +BS. No rebound. No HSM. EXTR: No c/c/e PSYCH: Normally interactive. Conversant.  Back: She has some tightness and spasm of the bilateral paraspinous muscles.  No bony tenderness is noted Stiffness in forward flexion, extension is more normal  Small wound on left ankle- update tetanus today but no further treatment needed Assessment and Plan: Essential hypertension - Plan: Basic metabolic panel, CBC  Pre-diabetes - Plan: Hemoglobin A1c  Peripheral polyneuropathy - Plan: Vitamin B12, Hemoglobin A1c  Age related osteoporosis, unspecified pathological fracture presence  Dyslipidemia - Plan: Lipid panel  B12 deficiency - Plan: Vitamin B12  Wound of left lower extremity, initial  encounter - Plan: Td vaccine greater than or equal to 7yo preservative free IM  Lumbar pain - Plan: Ambulatory referral to Physical Therapy, DG Lumbar Spine Complete  Recurrent sinus infections - Plan: doxycycline (VIBRAMYCIN) 100 MG capsule  Patient is following up today. Blood pressure well controlled, continue current medications Labs are pending as above- Will plan further follow- up pending labs. Referral to physical therapy for lower back pain, obtain plain films today [name] tends to get periodic sinusitis, she likes to have a prescription for antibiotics on hand in case she needs it while out of town  This visit occurred during the [employer] health emergency.  Safety protocols were in place, including screening questions prior to the visit, additional usage of staff PPE, and extensive cleaning of exam room while observing appropriate  contact time as indicated for disinfecting solutions.    Signed Lamar Blinks, MD  Received her labs as below, message to patient  Results for orders placed or performed in visit on 03/11/21  Vitamin B12  Result Value Ref Range   Vitamin B-12 939 (H) 211 - 911 pg/mL  Basic metabolic panel  Result Value Ref Range   Sodium 137 135 - 145 mEq/L   Potassium 4.1 3.5 - 5.1 mEq/L   Chloride 102 96 - 112 mEq/L   CO2 29 19 - 32 mEq/L   Glucose, Bld 104 (H) 70 - 99 mg/dL   BUN 21 6 - 23 mg/dL   Creatinine, Ser 0.89 0.40 - 1.20 mg/dL   GFR 60.66 >60.00 mL/min   Calcium 10.1 8.4 - 10.5 mg/dL  CBC  Result Value Ref Range   WBC 7.0 4.0 - 10.5 K/uL   RBC 4.64 3.87 - 5.11 Mil/uL   Platelets 276.0 150.0 - 400.0 K/uL   Hemoglobin 13.8 12.0 - 15.0 g/dL   HCT 40.8 36.0 - 46.0 %   MCV 88.0 78.0 - 100.0 fl   MCHC 33.7 30.0 - 36.0 g/dL   RDW 12.7 11.5 - 15.5 %  Hemoglobin A1c  Result Value Ref Range   Hgb A1c MFr Bld 5.7 4.6 - 6.5 %  Lipid panel  Result Value Ref Range   Cholesterol 125 0 - 200 mg/dL   Triglycerides 228.0 (H) 0.0 - 149.0 mg/dL    HDL 44.40 >39.00 mg/dL   VLDL 45.6 (H) 0.0 - 40.0 mg/dL   Total CHOL/HDL Ratio 3    NonHDL 80.64   LDL cholesterol, direct  Result Value Ref Range   Direct LDL 44.0 mg/dL

## 2021-03-09 NOTE — Patient Instructions (Addendum)
Great to see you today!  Please consider getting the shingles vaccine if not done already Please see me in 6 months assuming all is well Tetanus booster today Please stop by lab and then the imaging dept on the ground floor for x-rays I will be in touch with your x-ray reports We will set you up for physical therapy  Watch for stomach irritation with Aleve- we can consider celebrex if needed

## 2021-03-11 ENCOUNTER — Ambulatory Visit (INDEPENDENT_AMBULATORY_CARE_PROVIDER_SITE_OTHER): Payer: Medicare Other | Admitting: Family Medicine

## 2021-03-11 ENCOUNTER — Encounter: Payer: Self-pay | Admitting: Family Medicine

## 2021-03-11 ENCOUNTER — Other Ambulatory Visit: Payer: Self-pay

## 2021-03-11 ENCOUNTER — Ambulatory Visit (HOSPITAL_BASED_OUTPATIENT_CLINIC_OR_DEPARTMENT_OTHER)
Admission: RE | Admit: 2021-03-11 | Discharge: 2021-03-11 | Disposition: A | Discharge status: Home / Self Care | Payer: Medicare Other | Source: Ambulatory Visit | Attending: Family Medicine | Admitting: Family Medicine

## 2021-03-11 VITALS — BP 126/72 | HR 80 | Temp 98.0°F | Resp 17 | Ht 69.5 in | Wt 177.0 lb

## 2021-03-11 DIAGNOSIS — J329 Chronic sinusitis, unspecified: Secondary | ICD-10-CM

## 2021-03-11 DIAGNOSIS — R7303 Prediabetes: Secondary | ICD-10-CM

## 2021-03-11 DIAGNOSIS — E785 Hyperlipidemia, unspecified: Secondary | ICD-10-CM | POA: Diagnosis not present

## 2021-03-11 DIAGNOSIS — Z23 Encounter for immunization: Secondary | ICD-10-CM | POA: Diagnosis not present

## 2021-03-11 DIAGNOSIS — E538 Deficiency of other specified B group vitamins: Secondary | ICD-10-CM

## 2021-03-11 DIAGNOSIS — M545 Low back pain, unspecified: Secondary | ICD-10-CM

## 2021-03-11 DIAGNOSIS — I10 Essential (primary) hypertension: Secondary | ICD-10-CM

## 2021-03-11 DIAGNOSIS — Z0001 Encounter for general adult medical examination with abnormal findings: Secondary | ICD-10-CM

## 2021-03-11 DIAGNOSIS — S81802A Unspecified open wound, left lower leg, initial encounter: Secondary | ICD-10-CM

## 2021-03-11 DIAGNOSIS — G629 Polyneuropathy, unspecified: Secondary | ICD-10-CM

## 2021-03-11 DIAGNOSIS — M810 Age-related osteoporosis without current pathological fracture: Secondary | ICD-10-CM

## 2021-03-11 LAB — BASIC METABOLIC PANEL
BUN: 21 mg/dL (ref 6–23)
CO2: 29 mEq/L (ref 19–32)
Calcium: 10.1 mg/dL (ref 8.4–10.5)
Chloride: 102 mEq/L (ref 96–112)
Creatinine, Ser: 0.89 mg/dL (ref 0.40–1.20)
GFR: 60.66 mL/min (ref >60.00)
Glucose, Bld: 104 mg/dL — ABNORMAL HIGH (ref 70–99)
Potassium: 4.1 mEq/L (ref 3.5–5.1)
Sodium: 137 mEq/L (ref 135–145)

## 2021-03-11 LAB — CBC
HCT: 40.8 % (ref 36.0–46.0)
Hemoglobin: 13.8 g/dL (ref 12.0–15.0)
MCHC: 33.7 g/dL (ref 30.0–36.0)
MCV: 88 fl (ref 78.0–100.0)
Platelets: 276 10*3/uL (ref 150.0–400.0)
RBC: 4.64 Mil/uL (ref 3.87–5.11)
RDW: 12.7 % (ref 11.5–15.5)
WBC: 7 10*3/uL (ref 4.0–10.5)

## 2021-03-11 LAB — LDL CHOLESTEROL, DIRECT: Direct LDL: 44 mg/dL

## 2021-03-11 LAB — LIPID PANEL
Cholesterol: 125 mg/dL (ref 0–200)
HDL: 44.4 mg/dL (ref >39.00)
NonHDL: 80.64
Total CHOL/HDL Ratio: 3
Triglycerides: 228 mg/dL — ABNORMAL HIGH (ref 0.0–149.0)
VLDL: 45.6 mg/dL — ABNORMAL HIGH (ref 0.0–40.0)

## 2021-03-11 LAB — VITAMIN B12: Vitamin B-12: 939 pg/mL — ABNORMAL HIGH (ref 211–911)

## 2021-03-11 LAB — HEMOGLOBIN A1C: Hgb A1c MFr Bld: 5.7 % (ref 4.6–6.5)

## 2021-03-11 MED ORDER — DOXYCYCLINE HYCLATE 100 MG PO CAPS: 100.0000 mg | ORAL_CAPSULE | Freq: Two times a day (BID) | ORAL | 0 refills | Status: DC

## 2021-03-12 ENCOUNTER — Encounter: Payer: Self-pay | Admitting: Family Medicine

## 2021-03-15 ENCOUNTER — Other Ambulatory Visit: Payer: Self-pay | Admitting: Family Medicine

## 2021-03-26 ENCOUNTER — Encounter: Payer: Self-pay | Admitting: Physical Therapy

## 2021-03-26 ENCOUNTER — Other Ambulatory Visit: Payer: Self-pay

## 2021-03-26 ENCOUNTER — Ambulatory Visit: Payer: Medicare Other | Attending: Family Medicine | Admitting: Physical Therapy

## 2021-03-26 DIAGNOSIS — R262 Difficulty in walking, not elsewhere classified: Secondary | ICD-10-CM

## 2021-03-26 DIAGNOSIS — R2681 Unsteadiness on feet: Secondary | ICD-10-CM | POA: Diagnosis present

## 2021-03-26 DIAGNOSIS — M545 Low back pain, unspecified: Secondary | ICD-10-CM | POA: Insufficient documentation

## 2021-03-26 DIAGNOSIS — M62830 Muscle spasm of back: Secondary | ICD-10-CM

## 2021-03-26 DIAGNOSIS — M6281 Muscle weakness (generalized): Secondary | ICD-10-CM | POA: Diagnosis present

## 2021-03-26 NOTE — Patient Instructions (Signed)
      Access Code: [email or web address] URL: [email or web address] Date: 03/26/2021 Prepared by: Annie Paras  Exercises Hooklying Hamstring Stretch with Strap - 2-3 x daily - 7 x weekly - 3 reps - 30 sec hold Supine ITB Stretch with Strap - 2-3 x daily - 7 x weekly - 3 reps - 30 sec hold Supine Quadriceps Stretch with Strap on Table - 2-3 x daily - 7 x weekly - 3 reps - 30 sec hold Supine Single Knee to Chest Stretch - 2-3 x daily - 7 x weekly - 3 reps - 30 sec hold Supine Piriformis Stretch with Foot on Ground - 2-3 x daily - 7 x weekly - 3 reps - 30 sec hold Supine Posterior Pelvic Tilt - 2-3 x daily - 7 x weekly - 2 sets - 10 reps - 5 sec hold Supine Lower Trunk Rotation - 2-3 x daily - 7 x weekly - 5 reps - 10 sec hold Seated Flexion Stretch with Swiss Ball - 2-3 x daily - 7 x weekly - 3 reps - 30 sec hold Seated Thoracic Flexion and Rotation with Swiss Ball - 2-3 x daily - 7 x weekly - 2 sets - 3 reps - 30 sec hold

## 2021-03-26 NOTE — Therapy (Signed)
[facility] [address]  [place], [place], [address] Phone: [phone]   Fax:  [phone]  Physical Therapy Evaluation  Patient Details  Name: Katrina Lucas MRN: [id number] Date of Birth: [birth year] Referring Provider (PT): Lamar Blinks, MD   Encounter Date: 03/26/2021   PT End of Session - 03/26/21 0805    Visit Number 1    Number of Visits 12    Date for PT Re-Evaluation 05/07/21    Authorization Type Blue Medicare & T[employer] for Life    PT Start Time 0805    PT Stop Time 0904    PT Time Calculation (min) 59 min    Activity Tolerance Patient tolerated treatment well    Behavior During Therapy [facility] for tasks assessed/performed           Past Medical History:  Diagnosis Date  . Allergy   . Arthritis   . Chicken pox   . Colon polyp   . Diverticulitis   . GERD (gastroesophageal reflux disease)   . Heart murmur   . Hyperlipidemia   . Hypertension     History reviewed. No pertinent surgical history.  There were no vitals filed for this visit.    Subjective Assessment - 03/26/21 0809    Subjective Pt reports she has had terrible back pain since January. No know MOI but did sustain a fall on the ice landing on her knees in January. Pain has been mostly constant. Pain limits positional tolerance but uncertain of duration.    Limitations Sitting;Standing;Walking;House hold activities    How long can you stand comfortably? 15-20 minutes    Diagnostic tests 03/11/21 Lumbar x-ray: 1. No acute osseous abnormality. 2. 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    Patient Stated Goals "to get some strengthening exercises to lessen the pain"    Currently in Pain? Yes    Pain Score 3    up to 10/10 at worst; 10/10 upon rising this morning   Pain Location Back    Pain Orientation Lower    Pain Descriptors / Indicators  Aching   "agonizing & annoying"   Pain Type Acute pain    Pain Radiating Towards up back to shoulders if standing for prolonged time; denies LE radiculopathy; neuropathy in B feet    Pain Onset More than a month ago   January 2022   Pain Frequency Constant    Aggravating Factors  prolonged standing    Pain Relieving Factors Tylenol & Aleve; heat; improves as the day progresses    Effect of Pain on Daily Activities pain makes her feel like not doing anything except sitting around and reading a book              [facility] PT Assessment - 03/26/21 0805      Assessment   Medical Diagnosis Lumbar pain    Referring Provider (PT) Lamar Blinks, MD    Onset Date/Surgical Date --   Jan 2022   Next MD Visit ~Sept 2020    Prior Therapy none for current problem; h/o PT for L shoulder pain ~3 yrs ago      Precautions   Precautions None      Restrictions   Weight Bearing Restrictions No      Balance Screen   Has the patient fallen in the past 6 months Yes    How many times? 1  Has the patient had a decrease in activity level because of a fear of falling?  Yes    Is the patient reluctant to leave their home because of a fear of falling?  No      Home Environment   Living Environment Private residence    Living Arrangements Alone    Type of [place] Access Level entry    [place] Two level;Laundry or work area in basement;Able to live on main level with bedroom/bathroom      Prior Function   Level of Independence Independent    Vocation Retired    Leisure gym 4-5x/wk (has not been in ~6 wks); play golf; crafts; gardening      Cognition   Overall Cognitive Status Within Functional Limits for tasks assessed      Observation/Other Assessments   Focus on Therapeutic Outcomes (FOTO)  Lumbar: FS = 61, predicted D/C FS = 71      ROM / Strength   AROM / PROM / Strength AROM;Strength      AROM   AROM Assessment Site Lumbar    Lumbar Flexion hands to mid shins     Lumbar Extension 50% limited    Lumbar - Right Side Bend hand to lateral joint line of knee    Lumbar - Left Side Bend hand to lateral joint line of knee - R LBP    Lumbar - Right Rotation 25% limited    Lumbar - Left Rotation 25% limited      Strength   Strength Assessment Site Hip;Knee;Ankle    Right/Left Hip Right;Left    Right Hip Flexion 4-/5    Right Hip Extension 3+/5    Right Hip External Rotation  4-/5    Right Hip Internal Rotation 4-/5    Right Hip ABduction 4-/5    Right Hip ADduction 3+/5    Left Hip Flexion 4-/5    Left Hip Extension 4-/5    Left Hip External Rotation 4/5    Left Hip Internal Rotation 4/5    Left Hip ABduction 4/5    Left Hip ADduction 4-/5    Right/Left Knee Right;Left    Right Knee Flexion 5/5    Right Knee Extension 5/5    Left Knee Flexion 5/5    Left Knee Extension 5/5    Right/Left Ankle Right;Left    Right Ankle Dorsiflexion 5/5    Left Ankle Dorsiflexion 5/5      Flexibility   Soft Tissue Assessment /Muscle Length yes    Hamstrings mod tight B    Quadriceps mod tight quads and hip flexors    ITB mod tight B    Piriformis mod tight B    Obturator Internus mod titgh B      Palpation   Spinal mobility hypomobility t/o lumbar spine with pain noted with CPAs    Palpation comment increased muscle tension & TTP in B lumbar paraspinals & upper glutes - pressure over L glute causes referred pain to R buttock                      Objective measurements completed on examination: See above findings.               PT Education - 03/26/21 0900    Education Details PT eval findings, anticipated POC & initial HEP - Access Code: [email or web address]    Person(s) Educated Patient    Methods Explanation;Demonstration;Verbal cues;Handout  Comprehension Verbalized understanding;Verbal cues required;Returned demonstration;Need further instruction            PT Short Term Goals - 03/26/21 0904      PT SHORT TERM GOAL #1    Title Patient will be independent with initial HEP    Status New    Target Date 04/09/21      PT SHORT TERM GOAL #2   Title Patient will verbalize/demonstrate understanding of neutral spine posture and proper body mechanics to reduce strain on lumbar spine    Status New    Target Date 04/16/21             PT Long Term Goals - 03/26/21 0904      PT LONG TERM GOAL #1   Title Patient will be independent with ongoing/advanced HEP +/- gym program for self-management at home    Status New    Target Date 05/07/21      PT LONG TERM GOAL #2   Title Patient to demonstrate appropriate posture and body mechanics needed for daily activities    Status New    Target Date 05/07/21      PT LONG TERM GOAL #3   Title Decrease lumbar pain by >/= 50-75% allowing patient increased ease of positional and activity tolerance    Status New    Target Date 05/07/21      PT LONG TERM GOAL #4   Title Patient to improve lumbar AROM to [facility] without pain provocation    Status New    Target Date 05/07/21      PT LONG TERM GOAL #5   Title Patient will demonstrate improved B proximal LE strength to >/= 4 to 4+/5 for improved stability and ease of mobility    Status New    Target Date 05/07/21                  Plan - 03/26/21 0904    Clinical Impression Statement Katrina Lucas is an 80 y/o female who presents to OP PT for acute lumbar pain originating in January. Pt unsure of triggering event for pain but did fall on the ice in January landing on her knees. She denies radicular pain, numbness or tingling but does note preexisting peripheral neuropathy. She notes pain often worst in the morning upon rising and will sometimes subside as the day progresses, but pain usually makes her feel like just sitting around and not doing anything. Current deficits include lower back pain w/o radiculopathy but does note pain will spread to upper back when severe, decreased lumbar AROM especially in flexion and extension,  lumbar hypomobility with CPAs, limited proximal LE flexibility, increased muscle tension in lumbar paraspinals and upper glutes, mild to moderate proximal LE weakness and decreased awareness of good posture and body mechanics. Katrina Lucas will benefit from skilled PT to address above deficits to reduce myofascial pain and tightness and improve core strength to restore normal mobility and allow for increased participation in desired activities. As low back pain better managed, pt may also benefit from balance and gait stability assessment and treatment as indicated.    Personal Factors and Comorbidities Age;Comorbidity 3+;Time since onset of injury/illness/exacerbation;Past/Current Experience    Comorbidities B knee pain, peripheral polyneuropathy, osteoporosis, HTN, hearing loss, GERD, heart murmur    Examination-Activity Limitations Locomotion Level;Sit;Sleep;Stand;Stairs    Examination-Participation Restrictions Cleaning;Community Activity;Driving;Laundry;Meal Prep;Shop;Yard Work    Stability/Clinical Decision Making Stable/Uncomplicated    Clinical Decision Making Low    Rehab Potential Good  PT Frequency 2x / week    PT Duration 6 weeks    PT Treatment/Interventions ADLs/Self Care Home Management;Cryotherapy;Electrical Stimulation;Iontophoresis 4mg /ml Dexamethasone;Moist Heat;Traction;Ultrasound;Gait training;Stair training;Functional mobility training;Therapeutic activities;Therapeutic exercise;Balance training;Neuromuscular re-education;Patient/family education;Manual techniques;Passive range of motion;Dry needling;Taping;Spinal Manipulations    PT Next Visit Plan Review initial HEP; posture and body mechanics education; lumbopelvic flexibility & strengthening; manual therapy as tolerated to address increased muscle tension in glutes and lumbar paraspinals    PT Home Exercise Plan MedBridge Access Code: [email or web address] (4/5)    Consulted and Agree with Plan of Care Patient           Patient will benefit  from skilled therapeutic intervention in order to improve the following deficits and impairments:  Decreased activity tolerance,Decreased balance,Decreased endurance,Decreased knowledge of precautions,Decreased mobility,Decreased range of motion,Decreased safety awareness,Decreased strength,Difficulty walking,Hypomobility,Increased fascial restricitons,Increased muscle spasms,Impaired perceived functional ability,Impaired flexibility,Improper body mechanics,Postural dysfunction,Pain  Visit Diagnosis: Acute bilateral low back pain without sciatica  Muscle spasm of back  Muscle weakness (generalized)  Difficulty in walking, not elsewhere classified  Unsteadiness on feet     Problem List Patient Active Problem List   Diagnosis Date Noted  . Osteoporosis 08/31/2019  . Pre-diabetes 08/31/2019  . Essential hypertension 08/25/2018  . Peripheral polyneuropathy 08/25/2018  . Dyslipidemia 08/25/2018  . Bilateral hearing loss 08/25/2018    Percival Spanish, PT, MPT 03/26/2021, 11:34 AM  [facility] [address]  [place], [place], [address] Phone: [phone]   Fax:  [phone]  Name: Katrina Lucas MRN: [id number] Date of Birth: [birth year]

## 2021-03-29 ENCOUNTER — Ambulatory Visit: Payer: Medicare Other

## 2021-03-29 ENCOUNTER — Other Ambulatory Visit: Payer: Self-pay

## 2021-03-29 DIAGNOSIS — M545 Low back pain, unspecified: Secondary | ICD-10-CM | POA: Diagnosis not present

## 2021-03-29 DIAGNOSIS — M62830 Muscle spasm of back: Secondary | ICD-10-CM

## 2021-03-29 DIAGNOSIS — R262 Difficulty in walking, not elsewhere classified: Secondary | ICD-10-CM

## 2021-03-29 DIAGNOSIS — M6281 Muscle weakness (generalized): Secondary | ICD-10-CM

## 2021-03-29 DIAGNOSIS — R2681 Unsteadiness on feet: Secondary | ICD-10-CM

## 2021-03-29 NOTE — Therapy (Signed)
[facility] [address]  [place], [place], [address] Phone: [phone]   Fax:  [phone]  Physical Therapy Treatment  Patient Details  Name: Katrina Lucas MRN: [id number] Date of Birth: 1941-07-22 Referring Provider (PT): Lamar Blinks, MD   Encounter Date: 03/29/2021   PT End of Session - 03/29/21 0907    Visit Number 2    Number of Visits 12    Date for PT Re-Evaluation 05/07/21    Authorization Type Blue Medicare & Tricare for Life    PT Start Time 0800    PT Stop Time 0855    PT Time Calculation (min) 55 min    Activity Tolerance Patient tolerated treatment well    Behavior During Therapy [facility] for tasks assessed/performed           Past Medical History:  Diagnosis Date  . Allergy   . Arthritis   . Chicken pox   . Colon polyp   . Diverticulitis   . GERD (gastroesophageal reflux disease)   . Heart murmur   . Hyperlipidemia   . Hypertension     History reviewed. No pertinent surgical history.  There were no vitals filed for this visit.   Subjective Assessment - 03/29/21 0802    Subjective Pt is doing fine, doesn't like doing her exercises but she does them.    Diagnostic tests 03/11/21 Lumbar x-ray: 1. No acute osseous abnormality. 2. 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    Patient Stated Goals "to get some strengthening exercises to lessen the pain"    Currently in Pain? Yes    Pain Score 3     Pain Location Back    Pain Orientation Lower    Pain Descriptors / Indicators Aching    Pain Type Acute pain                             [facility] Adult PT Treatment/Exercise - 03/29/21 0001      Exercises   Exercises Lumbar      Lumbar Exercises: Stretches   Passive Hamstring Stretch Right;Left;30 seconds    Passive Hamstring Stretch Limitations supine with strap    Single Knee  to Chest Stretch Right;Left;1 rep;30 seconds    Lower Trunk Rotation 5 reps;10 seconds    Hip Flexor Stretch Right;Left;1 rep;30 seconds    ITB Stretch Right;Left;30 seconds    ITB Stretch Limitations supine crossbody with strap    Piriformis Stretch Right;Left;1 rep;30 seconds    Piriformis Stretch Limitations supine KTOS      Lumbar Exercises: Aerobic   Nustep L1x29min      Lumbar Exercises: Supine   Pelvic Tilt 10 reps;5 seconds   PPT with tactile cues to flatten back     Manual Therapy   Manual Therapy Soft tissue mobilization    Soft tissue mobilization STM to B lumbar paraspinals, glutes, piriformis, QL, L more than R                  PT Education - 03/29/21 0906    Education Details Education on sitting and standing posture, keeping proper alignment of spine and pelvis.    Person(s) Educated Patient    Methods Explanation;Demonstration;Verbal cues    Comprehension Verbalized understanding;Returned demonstration;Verbal cues required            PT Short Term  Goals - 03/29/21 0917      PT SHORT TERM GOAL #1   Title Patient will be independent with initial HEP    Status On-going    Target Date 04/09/21      PT SHORT TERM GOAL #2   Title Patient will verbalize/demonstrate understanding of neutral spine posture and proper body mechanics to reduce strain on lumbar spine    Status On-going    Target Date 04/16/21             PT Long Term Goals - 03/29/21 0917      PT LONG TERM GOAL #1   Title Patient will be independent with ongoing/advanced HEP +/- gym program for self-management at home    Status On-going      PT LONG TERM GOAL #2   Title Patient to demonstrate appropriate posture and body mechanics needed for daily activities    Status On-going      PT LONG TERM GOAL #3   Title Decrease lumbar pain by >/= 50-75% allowing patient increased ease of positional and activity tolerance    Status On-going      PT LONG TERM GOAL #4   Title Patient to  improve lumbar AROM to [facility] without pain provocation    Status On-going      PT LONG TERM GOAL #5   Title Patient will demonstrate improved B proximal LE strength to >/= 4 to 4+/5 for improved stability and ease of mobility    Status On-going                 Plan - 03/29/21 0907    Clinical Impression Statement Reviewed intital HEP with pt to ensure understanding. She needed cueing during stretches to not pull into painful ROM but to feel a slight stretch in the muscles targeted. Observed lots of muscle tension and decreased ROM during all stretches. Tactile and verbal ceuing required for her to correctly perform a post pelvic tilt, with reminders for her to tighten her abdominals and flatten her back. Pt had a good response to STM with more tender points noted in the L lumbar paraspinals, pt c/o mild pain in glutes during MT but was able to tolerate it. Education given on keeping good alignment of spine with sitting and standing posture, focusing on keeping a neutral pelvis and depressed/retracted shoulders.    Personal Factors and Comorbidities Age;Comorbidity 3+;Time since onset of injury/illness/exacerbation;Past/Current Experience    Comorbidities B knee pain, peripheral polyneuropathy, osteoporosis, HTN, hearing loss, GERD, heart murmur    PT Frequency 2x / week    PT Duration 6 weeks    PT Treatment/Interventions ADLs/Self Care Home Management;Cryotherapy;Electrical Stimulation;Iontophoresis 4mg /ml Dexamethasone;Moist Heat;Traction;Ultrasound;Gait training;Stair training;Functional mobility training;Therapeutic activities;Therapeutic exercise;Balance training;Neuromuscular re-education;Patient/family education;Manual techniques;Passive range of motion;Dry needling;Taping;Spinal Manipulations    PT Next Visit Plan posture and body mechanics education; lumbopelvic flexibility & strengthening; manual therapy as tolerated to address increased muscle tension in glutes and lumbar paraspinals     PT Home Exercise Plan MedBridge Access Code: [email or web address] (4/5)    Consulted and Agree with Plan of Care Patient           Patient will benefit from skilled therapeutic intervention in order to improve the following deficits and impairments:  Decreased activity tolerance,Decreased balance,Decreased endurance,Decreased knowledge of precautions,Decreased mobility,Decreased range of motion,Decreased safety awareness,Decreased strength,Difficulty walking,Hypomobility,Increased fascial restricitons,Increased muscle spasms,Impaired perceived functional ability,Impaired flexibility,Improper body mechanics,Postural dysfunction,Pain  Visit Diagnosis: Acute bilateral low back pain without sciatica  Muscle spasm of back  Muscle weakness (generalized)  Difficulty in walking, not elsewhere classified  Unsteadiness on feet     Problem List Patient Active Problem List   Diagnosis Date Noted  . Osteoporosis 08/31/2019  . Pre-diabetes 08/31/2019  . Essential hypertension 08/25/2018  . Peripheral polyneuropathy 08/25/2018  . Dyslipidemia 08/25/2018  . Bilateral hearing loss 08/25/2018    Artist Pais, PTA 03/29/2021, 9:22 AM  [facility] [address]  [place], [place], [address] Phone: [phone]   Fax:  [phone]  Name: Katrina Lucas MRN: [id number] Date of Birth: 1941/02/15

## 2021-04-02 ENCOUNTER — Other Ambulatory Visit: Payer: Self-pay

## 2021-04-02 ENCOUNTER — Ambulatory Visit: Payer: Medicare Other

## 2021-04-02 DIAGNOSIS — M545 Low back pain, unspecified: Secondary | ICD-10-CM | POA: Diagnosis not present

## 2021-04-02 DIAGNOSIS — M6281 Muscle weakness (generalized): Secondary | ICD-10-CM

## 2021-04-02 DIAGNOSIS — M62830 Muscle spasm of back: Secondary | ICD-10-CM

## 2021-04-02 DIAGNOSIS — R262 Difficulty in walking, not elsewhere classified: Secondary | ICD-10-CM

## 2021-04-02 DIAGNOSIS — R2681 Unsteadiness on feet: Secondary | ICD-10-CM

## 2021-04-02 NOTE — Therapy (Signed)
[facility] [address]  Home [place], [place], [address] Phone: [phone]   Fax:  [phone]  Physical Therapy Treatment  Patient Details  Name: Katrina Lucas MRN: [id number] Date of Birth: 1941/05/29 Referring Provider (PT): Lamar Blinks, MD   Encounter Date: 04/02/2021   PT End of Session - 04/02/21 0844    Visit Number 3    Number of Visits 12    Date for PT Re-Evaluation 05/07/21    Authorization Type [employer] & T[employer] for Life    PT Start Time 0801    PT Stop Time 0842    PT Time Calculation (min) 41 min    Activity Tolerance Patient tolerated treatment well    Behavior During Therapy [facility] for tasks assessed/performed           Past Medical History:  Diagnosis Date  . Allergy   . Arthritis   . Chicken pox   . Colon polyp   . Diverticulitis   . GERD (gastroesophageal reflux disease)   . Heart murmur   . Hyperlipidemia   . Hypertension     History reviewed. No pertinent surgical history.  There were no vitals filed for this visit.   Subjective Assessment - 04/02/21 0803    Subjective Pt reports continued pain in low back.    Diagnostic tests 03/11/21 Lumbar x-ray: 1. No acute osseous abnormality. 2. 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    Patient Stated Goals "to get some strengthening exercises to lessen the pain"    Currently in Pain? Yes    Pain Score 4     Pain Location Back    Pain Orientation Lower    Pain Descriptors / Indicators Aching    Pain Type Acute pain                             [facility] Adult PT Treatment/Exercise - 04/02/21 0001      Exercises   Exercises Lumbar      Lumbar Exercises: Aerobic   Nustep L2x81min      Lumbar Exercises: Supine   Pelvic Tilt 10 reps;5 seconds    Pelvic Tilt Limitations decreased ROM d/t tightness    Clam 10 reps;3  seconds    Clam Limitations R Tband    Bridge Compliant;10 reps    Straight Leg Raise 10 reps    Straight Leg Raises Limitations Bilateral    Other Supine Lumbar Exercises ball squeezes 10x3"    Other Supine Lumbar Exercises pedals with TrA 10 rep B      Manual Therapy   Manual Therapy Soft tissue mobilization;Myofascial release    Soft tissue mobilization STM to B lumbar paraspinals QL, L more than R    Myofascial Release manual TPR to L paraspinals                  PT Education - 04/02/21 0826    Education Details HEP update; Access Code: [email or web address]    Person(s) Educated Patient    Methods Explanation;Demonstration;Verbal cues;Tactile cues;Handout    Comprehension Verbalized understanding;Returned demonstration;Verbal cues required;Tactile cues required            PT Short Term Goals - 03/29/21 0917      PT SHORT TERM GOAL #1   Title Patient will be independent with initial HEP    Status  On-going    Target Date 04/09/21      PT SHORT TERM GOAL #2   Title Patient will verbalize/demonstrate understanding of neutral spine posture and proper body mechanics to reduce strain on lumbar spine    Status On-going    Target Date 04/16/21             PT Long Term Goals - 03/29/21 0917      PT LONG TERM GOAL #1   Title Patient will be independent with ongoing/advanced HEP +/- gym program for self-management at home    Status On-going      PT LONG TERM GOAL #2   Title Patient to demonstrate appropriate posture and body mechanics needed for daily activities    Status On-going      PT LONG TERM GOAL #3   Title Decrease lumbar pain by >/= 50-75% allowing patient increased ease of positional and activity tolerance    Status On-going      PT LONG TERM GOAL #4   Title Patient to improve lumbar AROM to [facility] without pain provocation    Status On-going      PT LONG TERM GOAL #5   Title Patient will demonstrate improved B proximal LE strength to >/= 4 to 4+/5 for improved  stability and ease of mobility    Status On-going                 Plan - 04/02/21 0845    Clinical Impression Statement Pt had continued reports of low back and was unsure if the exercises have been helping. Incorporated light lumbopelvic mobility and strengthening today, focusing on neutral pelvic alignment. Lots of tactile cues required to correctly perform pelvic tilt and to keep neutral pelvis. She continues to have TTP in her lumbar paraspinals more on the L side, no c/o pain during MT. Good response from pt with no complaints at end of session.    Personal Factors and Comorbidities Age;Comorbidity 3+;Time since onset of injury/illness/exacerbation;Past/Current Experience    Comorbidities B knee pain, peripheral polyneuropathy, osteoporosis, HTN, hearing loss, GERD, heart murmur    PT Frequency 2x / week    PT Duration 6 weeks    PT Treatment/Interventions ADLs/Self Care Home Management;Cryotherapy;Electrical Stimulation;Iontophoresis 4mg /ml Dexamethasone;Moist Heat;Traction;Ultrasound;Gait training;Stair training;Functional mobility training;Therapeutic activities;Therapeutic exercise;Balance training;Neuromuscular re-education;Patient/family education;Manual techniques;Passive range of motion;Dry needling;Taping;Spinal Manipulations    PT Next Visit Plan posture and body mechanics education; lumbopelvic flexibility & strengthening; manual therapy as tolerated to address increased muscle tension in glutes and lumbar paraspinals    PT Home Exercise Plan MedBridge Access Code: [email or web address] (4/5), Access Code: [email or web address] (4/12)    Consulted and Agree with Plan of Care Patient           Patient will benefit from skilled therapeutic intervention in order to improve the following deficits and impairments:  Decreased activity tolerance,Decreased balance,Decreased endurance,Decreased knowledge of precautions,Decreased mobility,Decreased range of motion,Decreased safety awareness,Decreased  strength,Difficulty walking,Hypomobility,Increased fascial restricitons,Increased muscle spasms,Impaired perceived functional ability,Impaired flexibility,Improper body mechanics,Postural dysfunction,Pain  Visit Diagnosis: Acute bilateral low back pain without sciatica  Muscle spasm of back  Muscle weakness (generalized)  Difficulty in walking, not elsewhere classified  Unsteadiness on feet     Problem List Patient Active Problem List   Diagnosis Date Noted  . Osteoporosis 08/31/2019  . Pre-diabetes 08/31/2019  . Essential hypertension 08/25/2018  . Peripheral polyneuropathy 08/25/2018  . Dyslipidemia 08/25/2018  . Bilateral hearing loss 08/25/2018    Artist Pais, PTA 04/02/2021, 9:40 AM  [facility] [address]  [place], [place], [address] Phone: [phone]   Fax:  [phone]  Name: Katrina Lucas MRN: [id number] Date of Birth: Nov 04, 1941

## 2021-04-05 ENCOUNTER — Encounter: Payer: Self-pay | Admitting: Physical Therapy

## 2021-04-05 ENCOUNTER — Ambulatory Visit: Payer: Medicare Other | Admitting: Physical Therapy

## 2021-04-05 ENCOUNTER — Other Ambulatory Visit: Payer: Self-pay

## 2021-04-05 DIAGNOSIS — R2681 Unsteadiness on feet: Secondary | ICD-10-CM

## 2021-04-05 DIAGNOSIS — M545 Low back pain, unspecified: Secondary | ICD-10-CM

## 2021-04-05 DIAGNOSIS — M62830 Muscle spasm of back: Secondary | ICD-10-CM

## 2021-04-05 DIAGNOSIS — M6281 Muscle weakness (generalized): Secondary | ICD-10-CM

## 2021-04-05 DIAGNOSIS — R262 Difficulty in walking, not elsewhere classified: Secondary | ICD-10-CM

## 2021-04-05 NOTE — Patient Instructions (Signed)

## 2021-04-05 NOTE — Therapy (Addendum)
[facility] [address]  [place], [place], [address] Phone: [phone]   Fax:  [phone]  Physical Therapy Treatment  Patient Details  Name: Katrina Lucas MRN: [id number] Date of Birth: 1941/06/30 Referring Provider (PT): Lamar Blinks, MD   Encounter Date: 04/05/2021   PT End of Session - 04/05/21 0801    Visit Number 4    Number of Visits 12    Date for PT Re-Evaluation 05/07/21    Authorization Type Blue Medicare & Tric[employer] for Life    PT Start Time 0801    PT Stop Time 0846    PT Time Calculation (min) 45 min    Activity Tolerance Patient tolerated treatment well    Behavior During Therapy [facility] for tasks assessed/performed           Past Medical History:  Diagnosis Date  . Allergy   . Arthritis   . Chicken pox   . Colon polyp   . Diverticulitis   . GERD (gastroesophageal reflux disease)   . Heart murmur   . Hyperlipidemia   . Hypertension     History reviewed. No pertinent surgical history.  There were no vitals filed for this visit.   Subjective Assessment - 04/05/21 0805    Subjective Pt reports yesterday she was hardly able to move due to pain but better by the time she went to bed last night and still better today. States she took a Tylenol at 6:00 this morning. She went to the gym yesterday for 45 min depsite the pain - 20 min on bike then arm & leg machines w/o worsening of her pain.    Diagnostic tests 03/11/21 Lumbar x-ray: 1. No acute osseous abnormality. 2. 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    Patient Stated Goals "to get some strengthening exercises to lessen the pain"    Currently in Pain? Yes    Pain Score 4    3-4/10   Pain Location Back    Pain Orientation Lower    Pain Descriptors / Indicators Aching    Pain Type Acute pain    Pain Frequency Constant                              [facility] Adult PT Treatment/Exercise - 04/05/21 0801      Self-Care   Self-Care Posture    Posture Provided education on proper posture and body mechanics for typical daily positioning ans tasks around the home to minimize low back strain.      Exercises   Exercises Lumbar      Lumbar Exercises: Aerobic   UBE (Upper Arm Bike) L1.0 x 6 min (3' fwd/3' back)   cues for upright posture     Lumbar Exercises: Standing   Row Both;15 reps;Strengthening;Theraband    Theraband Level (Row) Level 2 (Red)    Row Limitations cues for abdominal bracing & scap retraction    Shoulder Extension Both;10 reps;Strengthening;Theraband    Theraband Level (Shoulder Extension) Level 2 (Red)    Shoulder Extension Limitations cues for abdominal bracing & scap retraction                  PT Education - 04/05/21 0846    Education Details Posture & body mechanics education; guidance regarding providing feedback to massage therapist for upcoming massage  Person(s) Educated Patient    Methods Explanation;Demonstration;Verbal cues;Handout    Comprehension Verbalized understanding;Need further instruction            PT Short Term Goals - 04/05/21 0808      PT SHORT TERM GOAL #1   Title Patient will be independent with initial HEP    Status On-going    Target Date 04/09/21      PT SHORT TERM GOAL #2   Title Patient will verbalize/demonstrate understanding of neutral spine posture and proper body mechanics to reduce strain on lumbar spine    Status On-going   04/05/21 - Educational handout provided today   Target Date 04/16/21             PT Long Term Goals - 04/05/21 0808      PT LONG TERM GOAL #1   Title Patient will be independent with ongoing/advanced HEP +/- gym program for self-management at home    Status On-going    Target Date 05/07/21      PT LONG TERM GOAL #2   Title Patient to demonstrate appropriate posture and body mechanics needed for  daily activities    Status On-going    Target Date 05/07/21      PT LONG TERM GOAL #3   Title Decrease lumbar pain by >/= 50-75% allowing patient increased ease of positional and activity tolerance    Status On-going    Target Date 05/07/21      PT LONG TERM GOAL #4   Title Patient to improve lumbar AROM to [facility] without pain provocation    Status On-going    Target Date 05/07/21      PT LONG TERM GOAL #5   Title Patient will demonstrate improved B proximal LE strength to >/= 4 to 4+/5 for improved stability and ease of mobility    Status On-going    Target Date 05/07/21                 Plan - 04/05/21 0846    Clinical Impression Statement Katrina Lucas reports she continues to experience flare-ups of her LBP without identifiable triggers. Exercises do not seem to be a trigger but also do not seem to give significant relief of pain thus far. She continues to demonstrate a very forward head, rounded shoulder and flexed trunk posture, therefore education provided in proper posture and body mechanics for typical daily positioning and tasks around her home - pt reporting limitations in activities where squatting or  kneeling would be ideal, therefore discussed alternatives such as using a stool to sit on for tasks at a lower height or golfer's style lift for bending over to pick things up. Session concluded with introduction of upright postural strengthening targeting scapular retraction and abdominal bracing - pt requiring repeated cues for upright head & shoulder posture as well as desired muscle activation. Katrina Lucas reports she has scheduled a massage for next Tuesday to address some of the muscle knots (TPs) identified in low back and buttock muscle - guidance provided to ensure pt gives massage therapist feedback during session and discussed possibility of DN to address some of these TPs if massage does not fully address muscle tension.    Personal Factors and Comorbidities Age;Comorbidity 3+;Time  since onset of injury/illness/exacerbation;Past/Current Experience    Comorbidities B knee pain, peripheral polyneuropathy, osteoporosis, HTN, hearing loss, GERD, heart murmur    Examination-Activity Limitations Locomotion Level;Sit;Sleep;Stand;Stairs    Examination-Participation Restrictions Cleaning;Community Activity;Driving;Laundry;Meal Prep;Shop;Yard Work    Designer, multimedia  PT Frequency 2x / week    PT Duration 6 weeks    PT Treatment/Interventions ADLs/Self Care Home Management;Cryotherapy;Electrical Stimulation;Iontophoresis 4mg /ml Dexamethasone;Moist Heat;Traction;Ultrasound;Gait training;Stair training;Functional mobility training;Therapeutic activities;Therapeutic exercise;Balance training;Neuromuscular re-education;Patient/family education;Manual techniques;Passive range of motion;Dry needling;Taping;Spinal Manipulations    PT Next Visit Plan lumbopelvic flexibility & strengthening; manual therapy as tolerated to address increased muscle tension in glutes and lumbar paraspinals; review posture & body mechanics education PRN    PT Home Exercise Plan MedBridge Access Codes: [email or web address] 2023/04/18), [email or web address] (4/12)    Consulted and Agree with Plan of Care Patient           Patient will benefit from skilled therapeutic intervention in order to improve the following deficits and impairments:  Decreased activity tolerance,Decreased balance,Decreased endurance,Decreased knowledge of precautions,Decreased mobility,Decreased range of motion,Decreased safety awareness,Decreased strength,Difficulty walking,Hypomobility,Increased fascial restricitons,Increased muscle spasms,Impaired perceived functional ability,Impaired flexibility,Improper body mechanics,Postural dysfunction,Pain  Visit Diagnosis: Acute bilateral low back pain without sciatica  Muscle spasm of back  Muscle weakness (generalized)  Difficulty in walking, not elsewhere classified  Unsteadiness on feet     Problem  List Patient Active Problem List   Diagnosis Date Noted  . Osteoporosis 08/31/2019  . Pre-diabetes 08/31/2019  . Essential hypertension 08/25/2018  . Peripheral polyneuropathy 08/25/2018  . Dyslipidemia 08/25/2018  . Bilateral hearing loss 08/25/2018    Percival Spanish, PT, MPT 04/05/2021, 9:11 AM  [facility] [address]  [place], [place], [address] Phone: [phone]   Fax:  [phone]  Name: Katrina Lucas MRN: [id number] Date of Birth: January 14, 1941

## 2021-04-09 ENCOUNTER — Ambulatory Visit: Payer: Medicare Other

## 2021-04-09 ENCOUNTER — Other Ambulatory Visit: Payer: Self-pay

## 2021-04-09 DIAGNOSIS — R262 Difficulty in walking, not elsewhere classified: Secondary | ICD-10-CM

## 2021-04-09 DIAGNOSIS — R2681 Unsteadiness on feet: Secondary | ICD-10-CM

## 2021-04-09 DIAGNOSIS — M62830 Muscle spasm of back: Secondary | ICD-10-CM

## 2021-04-09 DIAGNOSIS — M545 Low back pain, unspecified: Secondary | ICD-10-CM | POA: Diagnosis not present

## 2021-04-09 DIAGNOSIS — M6281 Muscle weakness (generalized): Secondary | ICD-10-CM

## 2021-04-09 NOTE — Therapy (Signed)
[facility] [address]  [place], [place], [address] Phone: [phone]   Fax:  [phone]  Physical Therapy Treatment  Patient Details  Name: Katrina Lucas MRN: [id number] Date of Birth: September 05, 1941 Referring Provider (PT): Lamar Blinks, MD   Encounter Date: 04/09/2021   PT End of Session - 04/09/21 0843    Visit Number 5    Number of Visits 12    Date for PT Re-Evaluation 05/07/21    Authorization Type Blue Medicare & Tricare for Life    PT Start Time 0802    PT Stop Time 0843    PT Time Calculation (min) 41 min    Activity Tolerance Patient tolerated treatment well    Behavior During Therapy [facility] for tasks assessed/performed           Past Medical History:  Diagnosis Date  . Allergy   . Arthritis   . Chicken pox   . Colon polyp   . Diverticulitis   . GERD (gastroesophageal reflux disease)   . Heart murmur   . Hyperlipidemia   . Hypertension     History reviewed. No pertinent surgical history.  There were no vitals filed for this visit.   Subjective Assessment - 04/09/21 0805    Subjective Pt is doing ok, says that she has been hurting more this morning.    Diagnostic tests 03/11/21 Lumbar x-ray: 1. No acute osseous abnormality. 2. 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    Patient Stated Goals "to get some strengthening exercises to lessen the pain"    Currently in Pain? Yes    Pain Score 6     Pain Location Back    Pain Orientation Lower    Pain Descriptors / Indicators Aching    Pain Type Acute pain                             [facility] Adult PT Treatment/Exercise - 04/09/21 0001      Exercises   Exercises Lumbar      Lumbar Exercises: Stretches   Piriformis Stretch Right;Left;1 rep;30 seconds    Piriformis Stretch Limitations seated KTOS      Lumbar Exercises: Aerobic    Nustep L3x5mn      Lumbar Exercises: Standing   Row Strengthening;Both;20 reps;Theraband    Theraband Level (Row) Level 2 (Red)    Row Limitations cues to retract shld blade    Shoulder Extension Strengthening;Both;20 reps;Theraband    Theraband Level (Shoulder Extension) Level 2 (Red)    Shoulder Extension Limitations cues to retract shld blade    Other Standing Lumbar Exercises horizontal abd with Y Tband 5 reps; cueing for technique      Lumbar Exercises: Seated   Sit to Stand 5 reps    Sit to Stand Limitations with UE support    Other Seated Lumbar Exercises 3 way orange pball rollouts flexion 10x5", 5x5" B side bends      Lumbar Exercises: Supine   Bridge 10 reps;2 seconds    Bridge Limitations R Tband iso hip abd    Straight Leg Raise 10 reps    Straight Leg Raises Limitations both sides with 1# weight; 3" hold    Other Supine Lumbar Exercises bridge with orange pball 10 reps      Lumbar Exercises: Sidelying   Clam Right;Left;10 reps  Clam Limitations R Tband                    PT Short Term Goals - 04/09/21 0813      PT SHORT TERM GOAL #1   Title Patient will be independent with initial HEP    Status Achieved    Target Date 04/09/21      PT SHORT TERM GOAL #2   Title Patient will verbalize/demonstrate understanding of neutral spine posture and proper body mechanics to reduce strain on lumbar spine    Status On-going   04/05/21 - Educational handout provided today   Target Date 04/16/21             PT Long Term Goals - 04/05/21 0808      PT LONG TERM GOAL #1   Title Patient will be independent with ongoing/advanced HEP +/- gym program for self-management at home    Status On-going    Target Date 05/07/21      PT LONG TERM GOAL #2   Title Patient to demonstrate appropriate posture and body mechanics needed for daily activities    Status On-going    Target Date 05/07/21      PT LONG TERM GOAL #3   Title Decrease lumbar pain by >/= 50-75%  allowing patient increased ease of positional and activity tolerance    Status On-going    Target Date 05/07/21      PT LONG TERM GOAL #4   Title Patient to improve lumbar AROM to [facility] without pain provocation    Status On-going    Target Date 05/07/21      PT LONG TERM GOAL #5   Title Patient will demonstrate improved B proximal LE strength to >/= 4 to 4+/5 for improved stability and ease of mobility    Status On-going    Target Date 05/07/21                 Plan - 04/09/21 0844    Clinical Impression Statement Pt is independent with intial HEP so has met STG 1. She reported having slightly more pain today than usual, she reported to have taken tylenol at 6 am and alieve at 7 am which hadn't seemed to kick in. She responded well to the exercises. Progressed core stabilization exercises with no pain reported. She did report increased pain in low back during the standing postural exercises and noting that she can't stand more than 30 min w/o having to to take a seat d/t pain. Cues required during postural reeducation to retract scapulae and to isolate shoulders from elbow muscles. She still shows fwd head posture and education was given on properly aligning her neck and shoulders to reduce stress on her spine.    Personal Factors and Comorbidities Age;Comorbidity 3+;Time since onset of injury/illness/exacerbation;Past/Current Experience    Comorbidities B knee pain, peripheral polyneuropathy, osteoporosis, HTN, hearing loss, GERD, heart murmur    PT Frequency 2x / week    PT Duration 6 weeks    PT Treatment/Interventions ADLs/Self Care Home Management;Cryotherapy;Electrical Stimulation;Iontophoresis 64m/ml Dexamethasone;Moist Heat;Traction;Ultrasound;Gait training;Stair training;Functional mobility training;Therapeutic activities;Therapeutic exercise;Balance training;Neuromuscular re-education;Patient/family education;Manual techniques;Passive range of motion;Dry needling;Taping;Spinal  Manipulations    PT Next Visit Plan lumbopelvic flexibility & strengthening; manual therapy as tolerated to address increased muscle tension in glutes and lumbar paraspinals; review posture & body mechanics education PRN    PT Home Exercise Plan MedBridge Access Codes: [email or web address] ([email or web address], [email or web address] (4/12)    Consulted and Agree with Plan of  Care Patient           Patient will benefit from skilled therapeutic intervention in order to improve the following deficits and impairments:  Decreased activity tolerance,Decreased balance,Decreased endurance,Decreased knowledge of precautions,Decreased mobility,Decreased range of motion,Decreased safety awareness,Decreased strength,Difficulty walking,Hypomobility,Increased fascial restricitons,Increased muscle spasms,Impaired perceived functional ability,Impaired flexibility,Improper body mechanics,Postural dysfunction,Pain  Visit Diagnosis: Acute bilateral low back pain without sciatica  Muscle spasm of back  Muscle weakness (generalized)  Difficulty in walking, not elsewhere classified  Unsteadiness on feet     Problem List Patient Active Problem List   Diagnosis Date Noted  . Osteoporosis 08/31/2019  . Pre-diabetes 08/31/2019  . Essential hypertension 08/25/2018  . Peripheral polyneuropathy 08/25/2018  . Dyslipidemia 08/25/2018  . Bilateral hearing loss 08/25/2018    Artist Pais, PTA 04/09/2021, 9:13 AM  [facility] [address]  [place], [place], [address] Phone: [phone]   Fax:  [phone]  Name: Katrina Lucas MRN: [id number] Date of Birth: [birth year]

## 2021-04-09 NOTE — Therapy (Signed)
[facility] [address]  [place], [place], [address] Phone: [phone]   Fax:  [phone]  Physical Therapy Treatment  Patient Details  Name: Katrina Lucas MRN: [id number] Date of Birth: May 10, 1941 Referring Provider (PT): Lamar Blinks, MD   Encounter Date: 04/09/2021   PT End of Session - 04/09/21 0843    Visit Number 5    Number of Visits 12    Date for PT Re-Evaluation 05/07/21    Authorization Type Blue Medicare & Tric[employer] for Life    PT Start Time 0802    PT Stop Time 0843    PT Time Calculation (min) 41 min    Activity Tolerance Patient tolerated treatment well    Behavior During Therapy [facility] for tasks assessed/performed           Past Medical History:  Diagnosis Date  . Allergy   . Arthritis   . Chicken pox   . Colon polyp   . Diverticulitis   . GERD (gastroesophageal reflux disease)   . Heart murmur   . Hyperlipidemia   . Hypertension     History reviewed. No pertinent surgical history.  There were no vitals filed for this visit.   Subjective Assessment - 04/09/21 0805    Subjective Pt is doing ok, says that she has been hurting more this morning.    Diagnostic tests 03/11/21 Lumbar x-ray: 1. No acute osseous abnormality. 2. 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    Patient Stated Goals "to get some strengthening exercises to lessen the pain"    Currently in Pain? Yes    Pain Score 6     Pain Location Back    Pain Orientation Lower    Pain Descriptors / Indicators Aching    Pain Type Acute pain                             [facility] Adult PT Treatment/Exercise - 04/09/21 0001      Exercises   Exercises Lumbar      Lumbar Exercises: Stretches   Piriformis Stretch Right;Left;1 rep;30 seconds    Piriformis Stretch Limitations seated KTOS      Lumbar Exercises: Aerobic    Nustep L3x23mn      Lumbar Exercises: Standing   Row Strengthening;Both;20 reps;Theraband    Theraband Level (Row) Level 2 (Red)    Row Limitations cues to retract shld blade    Shoulder Extension Strengthening;Both;20 reps;Theraband    Theraband Level (Shoulder Extension) Level 2 (Red)    Shoulder Extension Limitations cues to retract shld blade    Other Standing Lumbar Exercises horizontal abd with Y Tband 5 reps; cueing for technique      Lumbar Exercises: Seated   Sit to Stand 5 reps    Sit to Stand Limitations with UE support    Other Seated Lumbar Exercises 3 way orange pball rollouts flexion 10x5", 5x5" B side bends      Lumbar Exercises: Supine   Bridge 10 reps;2 seconds    Bridge Limitations R Tband iso hip abd    Straight Leg Raise 10 reps    Straight Leg Raises Limitations both sides with 1# weight; 3" hold    Other Supine Lumbar Exercises bridge with orange pball 10 reps      Lumbar Exercises: Sidelying   Clam Right;Left;10 reps  Clam Limitations R Tband                    PT Short Term Goals - 04/09/21 0813      PT SHORT TERM GOAL #1   Title Patient will be independent with initial HEP    Status Achieved    Target Date 04/09/21      PT SHORT TERM GOAL #2   Title Patient will verbalize/demonstrate understanding of neutral spine posture and proper body mechanics to reduce strain on lumbar spine    Status On-going   04/05/21 - Educational handout provided today   Target Date 04/16/21             PT Long Term Goals - 04/05/21 0808      PT LONG TERM GOAL #1   Title Patient will be independent with ongoing/advanced HEP +/- gym program for self-management at home    Status On-going    Target Date 05/07/21      PT LONG TERM GOAL #2   Title Patient to demonstrate appropriate posture and body mechanics needed for daily activities    Status On-going    Target Date 05/07/21      PT LONG TERM GOAL #3   Title Decrease lumbar pain by >/= 50-75%  allowing patient increased ease of positional and activity tolerance    Status On-going    Target Date 05/07/21      PT LONG TERM GOAL #4   Title Patient to improve lumbar AROM to [facility] without pain provocation    Status On-going    Target Date 05/07/21      PT LONG TERM GOAL #5   Title Patient will demonstrate improved B proximal LE strength to >/= 4 to 4+/5 for improved stability and ease of mobility    Status On-going    Target Date 05/07/21                 Plan - 04/09/21 0844    Clinical Impression Statement Pt is independent with intial HEP so has met STG 1. She reported having slightly more pain today than usual, she reported to have taken tylenol at 6 am and alieve at 7 am which hadn't seemed to kick in. She responded well to the exercises. Progressed core stabilization exercises with no pain reported. She did report increased pain in low back during the standing postural exercises and noting that she can't stand more than 30 min w/o having to to take a seat d/t pain. Cues required during postural reeducation to retract scapulae and to isolate shoulders from elbow muscles. She still shows fwd head posture and education was given on properly aligning her neck and shoulders to reduce stress on her spine.    Personal Factors and Comorbidities Age;Comorbidity 3+;Time since onset of injury/illness/exacerbation;Past/Current Experience    Comorbidities B knee pain, peripheral polyneuropathy, osteoporosis, HTN, hearing loss, GERD, heart murmur    PT Frequency 2x / week    PT Duration 6 weeks    PT Treatment/Interventions ADLs/Self Care Home Management;Cryotherapy;Electrical Stimulation;Iontophoresis 4m/ml Dexamethasone;Moist Heat;Traction;Ultrasound;Gait training;Stair training;Functional mobility training;Therapeutic activities;Therapeutic exercise;Balance training;Neuromuscular re-education;Patient/family education;Manual techniques;Passive range of motion;Dry needling;Taping;Spinal  Manipulations    PT Next Visit Plan lumbopelvic flexibility & strengthening; manual therapy as tolerated to address increased muscle tension in glutes and lumbar paraspinals; review posture & body mechanics education PRN    PT Home Exercise Plan MedBridge Access Codes: [email or web address] (42024-04-23, [email or web address] (4/12)    Consulted and Agree with Plan of  Care Patient           Patient will benefit from skilled therapeutic intervention in order to improve the following deficits and impairments:  Decreased activity tolerance,Decreased balance,Decreased endurance,Decreased knowledge of precautions,Decreased mobility,Decreased range of motion,Decreased safety awareness,Decreased strength,Difficulty walking,Hypomobility,Increased fascial restricitons,Increased muscle spasms,Impaired perceived functional ability,Impaired flexibility,Improper body mechanics,Postural dysfunction,Pain  Visit Diagnosis: Acute bilateral low back pain without sciatica  Muscle spasm of back  Muscle weakness (generalized)  Difficulty in walking, not elsewhere classified  Unsteadiness on feet     Problem List Patient Active Problem List   Diagnosis Date Noted  . Osteoporosis 08/31/2019  . Pre-diabetes 08/31/2019  . Essential hypertension 08/25/2018  . Peripheral polyneuropathy 08/25/2018  . Dyslipidemia 08/25/2018  . Bilateral hearing loss 08/25/2018    Artist Pais, PTA 04/09/2021, 8:52 AM  [facility] [address]  [place], [place], [address] Phone: [phone]   Fax:  [phone]  Name: Katrina Lucas MRN: [id number] Date of Birth: 21-Apr-1941

## 2021-04-11 ENCOUNTER — Ambulatory Visit: Payer: Medicare Other | Admitting: Physical Therapy

## 2021-04-11 ENCOUNTER — Other Ambulatory Visit: Payer: Self-pay

## 2021-04-11 ENCOUNTER — Encounter: Payer: Self-pay | Admitting: Physical Therapy

## 2021-04-11 DIAGNOSIS — M545 Low back pain, unspecified: Secondary | ICD-10-CM

## 2021-04-11 DIAGNOSIS — M6281 Muscle weakness (generalized): Secondary | ICD-10-CM

## 2021-04-11 DIAGNOSIS — R262 Difficulty in walking, not elsewhere classified: Secondary | ICD-10-CM

## 2021-04-11 DIAGNOSIS — R2681 Unsteadiness on feet: Secondary | ICD-10-CM

## 2021-04-11 DIAGNOSIS — M62830 Muscle spasm of back: Secondary | ICD-10-CM

## 2021-04-11 NOTE — Therapy (Signed)
[facility] [address]  [place], [place], [address] Phone: [phone]   Fax:  [phone]  Physical Therapy Treatment  Patient Details  Name: Katrina Lucas MRN: [id number] Date of Birth: 25-Apr-1941 Referring Provider (PT): Lamar Blinks, MD   Encounter Date: 04/11/2021   PT End of Session - 04/11/21 0802    Visit Number 6    Number of Visits 12    Date for PT Re-Evaluation 05/07/21    Authorization Type Blue Medicare & Tric[employer] for Life    PT Start Time 0802    PT Stop Time 0846    PT Time Calculation (min) 44 min    Activity Tolerance Patient tolerated treatment well    Behavior During Therapy [facility] for tasks assessed/performed           Past Medical History:  Diagnosis Date  . Allergy   . Arthritis   . Chicken pox   . Colon polyp   . Diverticulitis   . GERD (gastroesophageal reflux disease)   . Heart murmur   . Hyperlipidemia   . Hypertension     History reviewed. No pertinent surgical history.  There were no vitals filed for this visit.   Subjective Assessment - 04/11/21 0805    Subjective Pt reports at the end of the day her pain is usually better but before that sometimes it is worse. Pt reports she felt "wonderful" after the massage on Tuesday but does not think it changed her pain. She states the massage therapist found "lots of knots".    Diagnostic tests 03/11/21 Lumbar x-ray: 1. No acute osseous abnormality. 2. 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    Patient Stated Goals "to get some strengthening exercises to lessen the pain"    Currently in Pain? Yes    Pain Score 4     Pain Location Back    Pain Orientation Lower    Pain Descriptors / Indicators Aching    Pain Type Acute pain    Pain Frequency Constant                             [facility] Adult PT Treatment/Exercise -  04/11/21 0802      Exercises   Exercises Lumbar      Lumbar Exercises: Stretches   Prone on Elbows Stretch 2 reps;30 seconds    Press Ups 5 reps;5 seconds    Press Ups Limitations partial press-up - pt noting increased discomfort    Other Lumbar Stretch Exercise Seated 3-way prayer stretch with green Pball 2 x 30 sec for each position      Lumbar Exercises: Aerobic   UBE (Upper Arm Bike) L2.0 x 6 min (3' fwd/3' back)      Lumbar Exercises: Standing   Heel Raises 20 reps;3 seconds    Heel Raises Limitations cues for abd bracing, quad & glute sets    Functional Squats 10 reps;5 seconds    Functional Squats Limitations counter squats - cues  for upright posture and posterior weight shift      Lumbar Exercises: Supine   Bent Knee Raise 10 reps;3 seconds   2 sets   Bent Knee Raise Limitations bracing marching; 2nd set with looped red TB at knees    Bridge 10 reps;3 seconds    Bridge Limitations + red TB hip ABD isometric  Bridge with [employer] 10 reps;3 seconds    Other Supine Lumbar Exercises DKTC with heels on peanut ball 20 x 3"    Other Supine Lumbar Exercises TrA + psoas march with looped red TB at feet 10 x 3"      Manual Therapy   Soft tissue mobilization STM to L>R lumbar paraspinals & QL - taut bands noted in L thoracic and lumbar paraspinals    Myofascial Release manual TPR and pin & stretch to L thoracolumbar paraspinals                    PT Short Term Goals - 04/11/21 0807      PT SHORT TERM GOAL #1   Title Patient will be independent with initial HEP    Status Achieved   04/09/21   Target Date 04/09/21      PT SHORT TERM GOAL #2   Title Patient will verbalize/demonstrate understanding of neutral spine posture and proper body mechanics to reduce strain on lumbar spine    Status Achieved   04/11/21   Target Date 04/16/21             PT Long Term Goals - 04/05/21 0808      PT LONG TERM GOAL #1   Title Patient will be independent with  ongoing/advanced HEP +/- gym program for self-management at home    Status On-going    Target Date 05/07/21      PT LONG TERM GOAL #2   Title Patient to demonstrate appropriate posture and body mechanics needed for daily activities    Status On-going    Target Date 05/07/21      PT LONG TERM GOAL #3   Title Decrease lumbar pain by >/= 50-75% allowing patient increased ease of positional and activity tolerance    Status On-going    Target Date 05/07/21      PT LONG TERM GOAL #4   Title Patient to improve lumbar AROM to [facility] without pain provocation    Status On-going    Target Date 05/07/21      PT LONG TERM GOAL #5   Title Patient will demonstrate improved B proximal LE strength to >/= 4 to 4+/5 for improved stability and ease of mobility    Status On-going    Target Date 05/07/21                 Plan - 04/11/21 0846    Clinical Impression Statement [name] reports her pain continues to fluctuate - typically better by the end of the day but often worse mid-day without identifiable trigger. With stretches and exercises, pt seems to demonstrate a flexion preference with slight increase in pain noted during extension with prone press-ups. Progressed lumbopelvic strengthening in supine and standing with frequent cues to ensure abdominal activation and avoid holding her breath. She reports her massage earlier this week felt good at the time but no significant change in her pain noted and continued taut bands and TPs evident in L thoracolumbar paraspinals - addressed these with further MT including STM & MFR.    Personal Factors and Comorbidities Age;Comorbidity 3+;Time since onset of injury/illness/exacerbation;Past/Current Experience    Comorbidities B knee pain, peripheral polyneuropathy, osteoporosis, HTN, hearing loss, GERD, heart murmur    Rehab Potential Good    PT Frequency 2x / week    PT Duration 6 weeks    PT Treatment/Interventions ADLs/Self Care Home  Management;Cryotherapy;Electrical Stimulation;Iontophoresis 4mg /ml Dexamethasone;Moist Heat;Traction;Ultrasound;Gait training;Stair training;Functional mobility  training;Therapeutic activities;Therapeutic exercise;Balance training;Neuromuscular re-education;Patient/family education;Manual techniques;Passive range of motion;Dry needling;Taping;Spinal Manipulations    PT Next Visit Plan lumbopelvic flexibility & strengthening; manual therapy as tolerated to address increased muscle tension in thoracolumbar paraspinals; review posture & body mechanics education PRN    PT Home Exercise Plan MedBridge Access Codes: [email or web address] 04/07/23), [email or web address] (4/12)    Consulted and Agree with Plan of Care Patient           Patient will benefit from skilled therapeutic intervention in order to improve the following deficits and impairments:  Decreased activity tolerance,Decreased balance,Decreased endurance,Decreased knowledge of precautions,Decreased mobility,Decreased range of motion,Decreased safety awareness,Decreased strength,Difficulty walking,Hypomobility,Increased fascial restricitons,Increased muscle spasms,Impaired perceived functional ability,Impaired flexibility,Improper body mechanics,Postural dysfunction,Pain  Visit Diagnosis: Acute bilateral low back pain without sciatica  Muscle spasm of back  Muscle weakness (generalized)  Difficulty in walking, not elsewhere classified  Unsteadiness on feet     Problem List Patient Active Problem List   Diagnosis Date Noted  . Osteoporosis 08/31/2019  . Pre-diabetes 08/31/2019  . Essential hypertension 08/25/2018  . Peripheral polyneuropathy 08/25/2018  . Dyslipidemia 08/25/2018  . Bilateral hearing loss 08/25/2018    Percival Spanish, PT, MPT 04/11/2021, 9:10 AM  [facility] [address]  [place], [place], [address] Phone: [phone]   Fax:  [phone]  Name: Katrina Lucas MRN: [id number] Date of Birth: 03-Apr-1941

## 2021-04-15 ENCOUNTER — Other Ambulatory Visit: Payer: Self-pay

## 2021-04-15 ENCOUNTER — Encounter: Payer: Self-pay | Admitting: Physical Therapy

## 2021-04-15 ENCOUNTER — Ambulatory Visit: Payer: Medicare Other | Admitting: Physical Therapy

## 2021-04-15 DIAGNOSIS — R2681 Unsteadiness on feet: Secondary | ICD-10-CM

## 2021-04-15 DIAGNOSIS — M6281 Muscle weakness (generalized): Secondary | ICD-10-CM

## 2021-04-15 DIAGNOSIS — M545 Low back pain, unspecified: Secondary | ICD-10-CM | POA: Diagnosis not present

## 2021-04-15 DIAGNOSIS — R262 Difficulty in walking, not elsewhere classified: Secondary | ICD-10-CM

## 2021-04-15 DIAGNOSIS — M62830 Muscle spasm of back: Secondary | ICD-10-CM

## 2021-04-15 NOTE — Therapy (Signed)
[facility] [address]  [place], [place], [address] Phone: [phone]   Fax:  [phone]  Physical Therapy Treatment  Patient Details  Name: Katrina Lucas MRN: [id number] Date of Birth: July 03, 1941 Referring Provider (PT): Lamar Blinks, MD   Encounter Date: 04/15/2021   PT End of Session - 04/15/21 0804    Visit Number 7    Number of Visits 12    Date for PT Re-Evaluation 05/07/21    Authorization Type Blue Medicare & Tricare for Life    PT Start Time 0804    PT Stop Time 0846    PT Time Calculation (min) 42 min    Activity Tolerance Patient tolerated treatment well    Behavior During Therapy [facility] for tasks assessed/performed           Past Medical History:  Diagnosis Date  . Allergy   . Arthritis   . Chicken pox   . Colon polyp   . Diverticulitis   . GERD (gastroesophageal reflux disease)   . Heart murmur   . Hyperlipidemia   . Hypertension     History reviewed. No pertinent surgical history.  There were no vitals filed for this visit.   Subjective Assessment - 04/15/21 0807    Subjective Pt states "I've decided I'm getting better" - no pain with sitting or lying down but still will have pain upon rising in the morning or when getting up during the night although no pain while in bed. This is better than previously. Pain typically gets better as the day progresses.    Diagnostic tests 03/11/21 Lumbar x-ray: 1. No acute osseous abnormality. 2. 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    Patient Stated Goals "to get some strengthening exercises to lessen the pain"    Currently in Pain? Yes    Pain Score 3     Pain Location Back    Pain Orientation Lower    Pain Descriptors / Indicators Nagging    Pain Type Acute pain    Pain Frequency Intermittent    Aggravating Factors  nothing specifically but  seems to "lock up" when she stops moving              [facility] PT Assessment - 04/15/21 0001      Strength   Right Hip Flexion 4-/5    Right Hip Extension 4-/5    Right Hip External Rotation  4-/5    Right Hip Internal Rotation 4-/5   pain at hip   Right Hip ABduction 4-/5    Right Hip ADduction 4-/5    Left Hip Flexion 4-/5    Left Hip Extension 4-/5    Left Hip External Rotation 4-/5    Left Hip Internal Rotation 4/5   pain at hip   Left Hip ABduction 4/5    Left Hip ADduction 4-/5                         [facility] Adult PT Treatment/Exercise - 04/15/21 0804      Lumbar Exercises: Aerobic   Nustep L3 x 63min      Lumbar Exercises: Supine   Clam 10 reps;3 seconds    Clam Limitations red TB alt hip ABD/ER    Bridge 10 reps;3 seconds    Bridge Limitations + red TB hip ABD isometric    Bridge with  Ball Squeeze 10 reps;3 seconds    Bridge with clamshell 10 reps;3 seconds    Bridge with [employer] Limitations red TB alt hip ABD/ER      Lumbar Exercises: Sidelying   Clam Right;Left;10 reps;3 seconds    Clam Limitations red TB    Other Sidelying Lumbar Exercises R/L red TB reverse                  PT Education - 04/15/21 0845    Education Details Updated strengthening HEP ([email or web address])    Person(s) Educated Patient    Methods Explanation;Demonstration;Verbal cues;Handout    Comprehension Verbalized understanding;Verbal cues required;Returned demonstration;Need further instruction            PT Short Term Goals - 04/11/21 0807      PT SHORT TERM GOAL #1   Title Patient will be independent with initial HEP    Status Achieved   04/09/21   Target Date 04/09/21      PT SHORT TERM GOAL #2   Title Patient will verbalize/demonstrate understanding of neutral spine posture and proper body mechanics to reduce strain on lumbar spine    Status Achieved   04/11/21   Target Date 04/16/21             PT Long Term Goals - 04/05/21 0808      PT LONG TERM  GOAL #1   Title Patient will be independent with ongoing/advanced HEP +/- gym program for self-management at home    Status On-going    Target Date 05/07/21      PT LONG TERM GOAL #2   Title Patient to demonstrate appropriate posture and body mechanics needed for daily activities    Status On-going    Target Date 05/07/21      PT LONG TERM GOAL #3   Title Decrease lumbar pain by >/= 50-75% allowing patient increased ease of positional and activity tolerance    Status On-going    Target Date 05/07/21      PT LONG TERM GOAL #4   Title Patient to improve lumbar AROM to [facility] without pain provocation    Status On-going    Target Date 05/07/21      PT LONG TERM GOAL #5   Title Patient will demonstrate improved B proximal LE strength to >/= 4 to 4+/5 for improved stability and ease of mobility    Status On-going    Target Date 05/07/21                 Plan - 04/15/21 0813    Clinical Impression Statement Pat notes improvement in her pain since start of PT with no pain while lying or sitting currently, although pain still an issue upon rising from bed. B hip MMT reassessed today with some improvement noted but she continues to demonstrate global B hip weakness at ~4-/5. Strengthening HEP updated to progress proximal LE strengthening in all planes will continuing to reinforce core/abdominal activation while reminding pt to avoid holding her breath. She will continue to benefit from skilled PT for progression of lumbopelvic strengthening in upright positions to improve standing and activity tolerance.    Personal Factors and Comorbidities Age;Comorbidity 3+;Time since onset of injury/illness/exacerbation;Past/Current Experience    Comorbidities B knee pain, peripheral polyneuropathy, osteoporosis, HTN, hearing loss, GERD, heart murmur    Rehab Potential Good    PT Frequency 2x / week    PT Duration 6 weeks    PT Treatment/Interventions ADLs/Self Care Home  Management;Cryotherapy;Dealer  Stimulation;Iontophoresis 4mg /ml Dexamethasone;Moist Heat;Traction;Ultrasound;Gait training;Stair training;Functional mobility training;Therapeutic activities;Therapeutic exercise;Balance training;Neuromuscular re-education;Patient/family education;Manual techniques;Passive range of motion;Dry needling;Taping;Spinal Manipulations    PT Next Visit Plan lumbopelvic flexibility & strengthening - progressing to upright positions; manual therapy as tolerated to address increased muscle tension in thoracolumbar paraspinals; review posture & body mechanics education PRN    PT Home Exercise Plan MedBridge Access Codes: [email or web address] 04/02/23), [email or web address] (4/12, updated 4/25)    Consulted and Agree with Plan of Care Patient           Patient will benefit from skilled therapeutic intervention in order to improve the following deficits and impairments:  Decreased activity tolerance,Decreased balance,Decreased endurance,Decreased knowledge of precautions,Decreased mobility,Decreased range of motion,Decreased safety awareness,Decreased strength,Difficulty walking,Hypomobility,Increased fascial restricitons,Increased muscle spasms,Impaired perceived functional ability,Impaired flexibility,Improper body mechanics,Postural dysfunction,Pain  Visit Diagnosis: Acute bilateral low back pain without sciatica  Muscle spasm of back  Muscle weakness (generalized)  Difficulty in walking, not elsewhere classified  Unsteadiness on feet     Problem List Patient Active Problem List   Diagnosis Date Noted  . Osteoporosis 08/31/2019  . Pre-diabetes 08/31/2019  . Essential hypertension 08/25/2018  . Peripheral polyneuropathy 08/25/2018  . Dyslipidemia 08/25/2018  . Bilateral hearing loss 08/25/2018    Percival Spanish, PT, MPT 04/15/2021, 9:30 AM  [facility] [address]  [place], [place], [address] Phone: [phone]    Fax:  [phone]  Name: HAIVYN ORAVEC MRN: [id number] Date of Birth: [birth year]

## 2021-04-15 NOTE — Patient Instructions (Signed)
    Access Code: [email or web address] URL: [email or web address] Date: 04/15/2021 Prepared by: Annie Paras  Exercises Bridge with Hip Abduction and Resistance - 1 x daily - 7 x weekly - 2 sets - 10 reps - 3-5 sec hold Clamshell with Resistance - 1 x daily - 7 x weekly - 2 sets - 10 reps - 3 sec hold Sidelying Reverse Clamshell with Resistance - 1 x daily - 7 x weekly - 2 sets - 10 reps - 3 sec hold Supine Bridge with Mini Swiss Ball Between Knees - 1 x daily - 7 x weekly - 2 sets - 10 reps - 3-5 sec hold

## 2021-04-17 ENCOUNTER — Ambulatory Visit: Payer: Medicare Other

## 2021-04-17 ENCOUNTER — Other Ambulatory Visit: Payer: Self-pay

## 2021-04-17 DIAGNOSIS — M545 Low back pain, unspecified: Secondary | ICD-10-CM | POA: Diagnosis not present

## 2021-04-17 DIAGNOSIS — R262 Difficulty in walking, not elsewhere classified: Secondary | ICD-10-CM

## 2021-04-17 DIAGNOSIS — M62830 Muscle spasm of back: Secondary | ICD-10-CM

## 2021-04-17 DIAGNOSIS — M6281 Muscle weakness (generalized): Secondary | ICD-10-CM

## 2021-04-17 DIAGNOSIS — R2681 Unsteadiness on feet: Secondary | ICD-10-CM

## 2021-04-17 NOTE — Therapy (Signed)
[facility] [address]  [place], [place], [address] Phone: [phone]   Fax:  [phone]  Physical Therapy Treatment  Patient Details  Name: Katrina Lucas MRN: [id number] Date of Birth: [birth year] Referring Provider (PT): Lamar Blinks, MD   Encounter Date: 04/17/2021   PT End of Session - 04/17/21 0842    Visit Number 8    Number of Visits 12    Date for PT Re-Evaluation 05/07/21    Authorization Type [employer] & Tric[employer] for Life    PT Start Time 0801    PT Stop Time 0840    PT Time Calculation (min) 39 min    Activity Tolerance Patient tolerated treatment well    Behavior During Therapy [facility] for tasks assessed/performed           Past Medical History:  Diagnosis Date  . Allergy   . Arthritis   . Chicken pox   . Colon polyp   . Diverticulitis   . GERD (gastroesophageal reflux disease)   . Heart murmur   . Hyperlipidemia   . Hypertension     History reviewed. No pertinent surgical history.  There were no vitals filed for this visit.   Subjective Assessment - 04/17/21 0806    Subjective Back feels better took tylenol this morning has some pain    Diagnostic tests 03/11/21 Lumbar x-ray: 1. No acute osseous abnormality. 2. 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    Patient Stated Goals "to get some strengthening exercises to lessen the pain"    Currently in Pain? Yes    Pain Score 3     Pain Location Back    Pain Orientation Lower    Pain Descriptors / Indicators Nagging    Pain Type Acute pain                             [facility] Adult PT Treatment/Exercise - 04/17/21 0001      Exercises   Exercises Lumbar;Knee/Hip      Lumbar Exercises: Aerobic   Recumbent Bike L2x24mn      Lumbar Exercises: Standing   Row Strengthening;Both;10 reps;Theraband    Theraband Level (Row)  Level 2 (Red)    Shoulder Extension Strengthening;Both;Theraband    Theraband Level (Shoulder Extension) Level 2 (Red)      Lumbar Exercises: Supine   Clam 10 reps;3 seconds    Clam Limitations G Tband; 10 add reps with SL fallout    Bridge 10 reps;3 seconds    Bridge Limitations GTband iso hip abd; 10 add reps straight leg on orange pball    Other Supine Lumbar Exercises trunk rotation on orange pball 10 reps both sides      Lumbar Exercises: Sidelying   Hip Abduction Right;Left;10 reps    Hip Abduction Limitations TC to remain on side, lack of TKE      Knee/Hip Exercises: Standing   Hip Flexion Stengthening;Both;1 set;10 reps;Knee bent    Hip Flexion Limitations R Tband around feet; 2 HHA at treadmill    Hip Abduction Stengthening;Both;1 set;10 reps;Knee straight    Abduction Limitations R Tband above ankles; 2 HHA at treadmill    Hip Extension Stengthening;Both;1 set;10 reps;Knee straight    Extension Limitations R Tband above ankles; 2 HHA at treadmill    Functional Squat 1 set;15 reps    Functional  Squat Limitations 2 HHA at treadmill                    PT Short Term Goals - 04/11/21 0807      PT SHORT TERM GOAL #1   Title Patient will be independent with initial HEP    Status Achieved   04/09/21   Target Date 04/09/21      PT SHORT TERM GOAL #2   Title Patient will verbalize/demonstrate understanding of neutral spine posture and proper body mechanics to reduce strain on lumbar spine    Status Achieved   04/11/21   Target Date 04/16/21             PT Long Term Goals - 04/17/21 0808      PT LONG TERM GOAL #1   Title Patient will be independent with ongoing/advanced HEP +/- gym program for self-management at home    Status On-going      PT LONG TERM GOAL #2   Title Patient to demonstrate appropriate posture and body mechanics needed for daily activities    Status On-going      PT LONG TERM GOAL #3   Title Decrease lumbar pain by >/= 50-75% allowing  patient increased ease of positional and activity tolerance    Status Achieved   Reports 80% improvement and decrease in pain     PT LONG TERM GOAL #4   Title Patient to improve lumbar AROM to [facility] without pain provocation    Status On-going      PT LONG TERM GOAL #5   Title Patient will demonstrate improved B proximal LE strength to >/= 4 to 4+/5 for improved stability and ease of mobility    Status On-going                 Plan - 04/17/21 0843    Clinical Impression Statement Pt responded well to progressed treatment. Demonstrated good performance of exercises w/o any reports of pain. Incorporated standing hip strengthening to increase her tolerance to standing positions and to increase global hip strength. Also edcuated her on keep good upright posture during scap stab exercises to facilitate better spine alignment. She reported 80% improvement and decrease in pain with LBP since coming to PT so has met LTG 3. With squats and SL bridges she showed some decreased WS to the L and at times tends to return to rounded shoulder posture.    Personal Factors and Comorbidities Age;Comorbidity 3+;Time since onset of injury/illness/exacerbation;Past/Current Experience    Comorbidities B knee pain, peripheral polyneuropathy, osteoporosis, HTN, hearing loss, GERD, heart murmur    PT Frequency 2x / week    PT Duration 6 weeks    PT Treatment/Interventions ADLs/Self Care Home Management;Cryotherapy;Electrical Stimulation;Iontophoresis 52m/ml Dexamethasone;Moist Heat;Traction;Ultrasound;Gait training;Stair training;Functional mobility training;Therapeutic activities;Therapeutic exercise;Balance training;Neuromuscular re-education;Patient/family education;Manual techniques;Passive range of motion;Dry needling;Taping;Spinal Manipulations    PT Next Visit Plan lumbopelvic flexibility & strengthening - progressing to upright positions; manual therapy as tolerated to address increased muscle tension in  thoracolumbar paraspinals; review posture & body mechanics education PRN    PT Home Exercise Plan MedBridge Access Codes: [email or web address] (04/15/23, [email or web address] (4/12, updated 4/25)    Consulted and Agree with Plan of Care Patient           Patient will benefit from skilled therapeutic intervention in order to improve the following deficits and impairments:  Decreased activity tolerance,Decreased balance,Decreased endurance,Decreased knowledge of precautions,Decreased mobility,Decreased range of motion,Decreased safety awareness,Decreased strength,Difficulty walking,Hypomobility,Increased fascial restricitons,Increased muscle spasms,Impaired perceived  functional ability,Impaired flexibility,Improper body mechanics,Postural dysfunction,Pain  Visit Diagnosis: Acute bilateral low back pain without sciatica  Muscle spasm of back  Muscle weakness (generalized)  Difficulty in walking, not elsewhere classified  Unsteadiness on feet     Problem List Patient Active Problem List   Diagnosis Date Noted  . Osteoporosis 08/31/2019  . Pre-diabetes 08/31/2019  . Essential hypertension 08/25/2018  . Peripheral polyneuropathy 08/25/2018  . Dyslipidemia 08/25/2018  . Bilateral hearing loss 08/25/2018    Artist Pais, PTA 04/17/2021, 8:51 AM  [facility] [address]  [place], [place], [address] Phone: [phone]   Fax:  [phone]  Name: Katrina Lucas MRN: [id number] Date of Birth: Sep 12, 1941

## 2021-04-30 ENCOUNTER — Encounter: Payer: Self-pay | Admitting: Physical Therapy

## 2021-04-30 ENCOUNTER — Ambulatory Visit: Payer: Medicare Other | Attending: Family Medicine | Admitting: Physical Therapy

## 2021-04-30 ENCOUNTER — Other Ambulatory Visit: Payer: Self-pay

## 2021-04-30 DIAGNOSIS — R262 Difficulty in walking, not elsewhere classified: Secondary | ICD-10-CM | POA: Insufficient documentation

## 2021-04-30 DIAGNOSIS — M6281 Muscle weakness (generalized): Secondary | ICD-10-CM | POA: Insufficient documentation

## 2021-04-30 DIAGNOSIS — M545 Low back pain, unspecified: Secondary | ICD-10-CM | POA: Insufficient documentation

## 2021-04-30 DIAGNOSIS — R2681 Unsteadiness on feet: Secondary | ICD-10-CM | POA: Insufficient documentation

## 2021-04-30 DIAGNOSIS — M62830 Muscle spasm of back: Secondary | ICD-10-CM | POA: Diagnosis present

## 2021-04-30 NOTE — Therapy (Signed)
[facility] [address]  [place], [place], [address] Phone: [phone]   Fax:  [phone]  Physical Therapy Treatment / Progress Note  Patient Details  Name: Katrina Lucas MRN: [id number] Date of Birth: 1941/05/22 Referring Provider (PT): Lamar Blinks, MD  Progress Note  Reporting Period 03/26/2021 to 04/30/2021  See note below for Objective Data and Assessment of Progress/Goals.      Encounter Date: 04/30/2021   PT End of Session - 04/30/21 0802    Visit Number 9    Number of Visits 12    Date for PT Re-Evaluation 05/07/21    Authorization Type Blue Medicare & Tricare for Life    PT Start Time 0802    PT Stop Time 0847    PT Time Calculation (min) 45 min    Activity Tolerance Patient tolerated treatment well    Behavior During Therapy [facility] for tasks assessed/performed           Past Medical History:  Diagnosis Date  . Allergy   . Arthritis   . Chicken pox   . Colon polyp   . Diverticulitis   . GERD (gastroesophageal reflux disease)   . Heart murmur   . Hyperlipidemia   . Hypertension     History reviewed. No pertinent surgical history.  There were no vitals filed for this visit.   Subjective Assessment - 04/30/21 0805    Subjective Pt feels like her OA is acting up today - states her back & L knee are hurting.    Diagnostic tests 03/11/21 Lumbar x-ray: 1. No acute osseous abnormality. 2. 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    Patient Stated Goals "to get some strengthening exercises to lessen the pain"    Currently in Pain? Yes    Pain Score 5     Pain Location Back    Pain Orientation Lower    Pain Descriptors / Indicators Other (Comment)   "gnawing"   Pain Type Acute pain    Pain Score 5   4-5/10   Pain Location Knee    Pain Orientation Left    Pain Type Chronic pain               [facility] PT Assessment - 04/30/21 0802      Assessment   Medical Diagnosis Lumbar pain    Referring Provider (PT) Lamar Blinks, MD    Onset Date/Surgical Date --   Jan 2022   Next MD Visit ~Sept 2020      AROM   Lumbar Flexion hands to lower shins    Lumbar Extension 25% limited    Lumbar - Right Side Bend hand to fibular head    Lumbar - Left Side Bend hand to fibular head - R LBP    Lumbar - Right Rotation 10% limited    Lumbar - Left Rotation 10% limited                         [facility] Adult PT Treatment/Exercise - 04/30/21 0802      Exercises   Exercises Lumbar      Lumbar Exercises: Stretches   Other Lumbar Stretch Exercise Seated 3-way prayer stretch with green Pball 2 x 30 sec for each position      Lumbar Exercises: Aerobic   Nustep L4 x 6 min  Lumbar Exercises: Seated   Long Arc Quad on [place] Both;10 reps    Hip Flexion on [place] Both;10 reps    Other Seated Lumbar Exercises Red TB horizontal ABD & UE diagonals x 10 each - seated on green Pball      Lumbar Exercises: Supine   Bridge with Ball Squeeze 10 reps;5 seconds    Bridge with [employer] Limitations cues for slow eccentric lowering    Bridge with clamshell 10 reps;5 seconds    Bridge with [employer] Limitations green TB B & alt hip ABD/ER x 1 set each      Lumbar Exercises: Sidelying   Clam Right;Left;10 reps;3 seconds    Clam Limitations green TB    Other Sidelying Lumbar Exercises R/L green TB reverse clam with ball btw knees x 10                  PT Education - 04/30/21 0845    Education Details HEP update - physioball stretches and exercises - Access Code: [email or web address]    Person(s) Educated Patient    Methods Explanation;Demonstration;Verbal cues;Handout    Comprehension Verbalized understanding;Verbal cues required;Returned demonstration;Need further instruction            PT Short Term Goals - 04/30/21 0809      PT SHORT TERM GOAL #1   Title Patient will  be independent with initial HEP    Status Achieved   04/09/21     PT SHORT TERM GOAL #2   Title Patient will verbalize/demonstrate understanding of neutral spine posture and proper body mechanics to reduce strain on lumbar spine    Status Achieved   04/11/21            PT Long Term Goals - 04/30/21 0810      PT LONG TERM GOAL #1   Title Patient will be independent with ongoing/advanced HEP +/- gym program for self-management at home    Status Partially Met    Target Date 05/07/21      PT LONG TERM GOAL #2   Title Patient to demonstrate appropriate posture and body mechanics needed for daily activities    Status Achieved   04/30/21     PT LONG TERM GOAL #3   Title Decrease lumbar pain by >/= 50-75% allowing patient increased ease of positional and activity tolerance    Status Achieved   Reports 80% improvement and decrease in pain     PT LONG TERM GOAL #4   Title Patient to improve lumbar AROM to [facility] without pain provocation    Status Partially Met    Target Date 05/07/21      PT LONG TERM GOAL #5   Title Patient will demonstrate improved B proximal LE strength to >/= 4 to 4+/5 for improved stability and ease of mobility    Status On-going    Target Date 05/07/21                 Plan - 04/30/21 0847    Clinical Impression Statement Katrina Lucas reports increased overall pain from her OA today but states she won't let it stop her - she is planning to start playing golf with a friend again. She reports good understanding of proper posture and body mechanics but does admit to still having to think about her posture. Lumbar ROM improved in all planes with pain only noted in flexion and R low back with L side bending. All STGs now met with majority of LTGs met  or partially met. Pt feels like she will be ready to transition to her HEP at the end of the current POC, therefore initiated HEP review adding physioball stretches and exercises at pt request as she has recently purchased a  physioball. Will plan to finalize review of HEP next visit and anticipate transition to HEP as of final goal assessment next week.    Personal Factors and Comorbidities Age;Comorbidity 3+;Time since onset of injury/illness/exacerbation;Past/Current Experience    Comorbidities B knee pain, peripheral polyneuropathy, osteoporosis, HTN, hearing loss, GERD, heart murmur    Rehab Potential Good    PT Frequency 2x / week    PT Duration 6 weeks    PT Treatment/Interventions ADLs/Self Care Home Management;Cryotherapy;Electrical Stimulation;Iontophoresis 61m/ml Dexamethasone;Moist Heat;Traction;Ultrasound;Gait training;Stair training;Functional mobility training;Therapeutic activities;Therapeutic exercise;Balance training;Neuromuscular re-education;Patient/family education;Manual techniques;Passive range of motion;Dry needling;Taping;Spinal Manipulations    PT Next Visit Plan HEP review & consolidation; lumbopelvic flexibility & strengthening - progressing to upright positions; manual therapy as tolerated to address increased muscle tension in thoracolumbar paraspinals; review posture & body mechanics education PRN    PT Home Exercise Plan MedBridge Access Codes: [email or web address] (2023-03-29, [email or web address] (4/12, updated 4/25); [email or web address] (5/10)    Consulted and Agree with Plan of Care Patient           Patient will benefit from skilled therapeutic intervention in order to improve the following deficits and impairments:  Decreased activity tolerance,Decreased balance,Decreased endurance,Decreased knowledge of precautions,Decreased mobility,Decreased range of motion,Decreased safety awareness,Decreased strength,Difficulty walking,Hypomobility,Increased fascial restricitons,Increased muscle spasms,Impaired perceived functional ability,Impaired flexibility,Improper body mechanics,Postural dysfunction,Pain  Visit Diagnosis: Acute bilateral low back pain without sciatica  Muscle spasm of back  Muscle weakness  (generalized)  Difficulty in walking, not elsewhere classified  Unsteadiness on feet     Problem List Patient Active Problem List   Diagnosis Date Noted  . Osteoporosis 08/31/2019  . Pre-diabetes 08/31/2019  . Essential hypertension 08/25/2018  . Peripheral polyneuropathy 08/25/2018  . Dyslipidemia 08/25/2018  . Bilateral hearing loss 08/25/2018    JPercival Lucas PT, MPT 04/30/2021, 9:55 AM  [id number] [place] [address]Phone: [phone]  Fax:  [phone] Name: PPAIZLIE KLAUSMRN: [id number] of Birth: [birth year]

## 2021-04-30 NOTE — Patient Instructions (Addendum)
      Access Code: [email or web address] URL: [email or web address] Date: 04/30/2021 Prepared by: Annie Paras  Exercises Seated Flexion Stretch with Swiss Ball - 2-3 x daily - 7 x weekly - 3 reps - 30 sec hold Seated Thoracic Flexion and Rotation with Swiss Ball - 2-3 x daily - 7 x weekly - 2 sets - 3 reps - 30 sec hold Swiss Ball March - 1 x daily - 7 x weekly - 2 sets - 10 reps - 3 sec hold Swiss Ball Knee Extension - 1 x daily - 7 x weekly - 2 sets - 10 reps - 3 sec hold Seated Shoulder W External Rotation on [employer] - 1 x daily - 7 x weekly - 2 sets - 10 reps - 3 sec hold Seated Upper Extremity Diagonals with Resistance on Swiss Ball - 1 x daily - 7 x weekly - 2 sets - 10 reps - 3 sec hold

## 2021-05-02 ENCOUNTER — Other Ambulatory Visit: Payer: Self-pay

## 2021-05-02 ENCOUNTER — Ambulatory Visit: Payer: Medicare Other

## 2021-05-02 DIAGNOSIS — R2681 Unsteadiness on feet: Secondary | ICD-10-CM

## 2021-05-02 DIAGNOSIS — M6281 Muscle weakness (generalized): Secondary | ICD-10-CM

## 2021-05-02 DIAGNOSIS — M545 Low back pain, unspecified: Secondary | ICD-10-CM

## 2021-05-02 DIAGNOSIS — M62830 Muscle spasm of back: Secondary | ICD-10-CM

## 2021-05-02 DIAGNOSIS — R262 Difficulty in walking, not elsewhere classified: Secondary | ICD-10-CM

## 2021-05-02 NOTE — Therapy (Signed)
[facility] [address]  [place], [place], [address] Phone: [phone]   Fax:  [phone]  Physical Therapy Treatment  Patient Details  Name: Katrina Lucas MRN: [id number] Date of Birth: 10-21-1941 Referring Provider (PT): Lamar Blinks, MD   Encounter Date: 05/02/2021   PT End of Session - 05/02/21 0943    Visit Number 10    Number of Visits 12    Date for PT Re-Evaluation 05/07/21    Authorization Type Blue Medicare & Tricare for Life    PT Start Time 0845    PT Stop Time 0926    PT Time Calculation (min) 41 min    Activity Tolerance Patient tolerated treatment well    Behavior During Therapy [facility] for tasks assessed/performed           Past Medical History:  Diagnosis Date  . Allergy   . Arthritis   . Chicken pox   . Colon polyp   . Diverticulitis   . GERD (gastroesophageal reflux disease)   . Heart murmur   . Hyperlipidemia   . Hypertension     History reviewed. No pertinent surgical history.  There were no vitals filed for this visit.   Subjective Assessment - 05/02/21 0845    Subjective Pt reports that her back feels the same in the morning but gets better during the day.    Diagnostic tests 03/11/21 Lumbar x-ray: 1. No acute osseous abnormality. 2. 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    Patient Stated Goals "to get some strengthening exercises to lessen the pain"    Currently in Pain? Yes    Pain Score 5     Pain Location Back    Pain Orientation Lower    Pain Descriptors / Indicators Aching                             [facility] Adult PT Treatment/Exercise - 05/02/21 0001      Exercises   Exercises Lumbar      Lumbar Exercises: Stretches   Double Knee to Chest Stretch 3 reps;10 seconds    Double Knee to Chest Stretch Limitations supine      Lumbar Exercises: Aerobic    Nustep L4 x 6 min      Lumbar Exercises: Seated   Other Seated Lumbar Exercises 3 way green pball prayer stretch 5x10"      Lumbar Exercises: Supine   Bridge 20 reps;2 seconds    Bridge Limitations straight leg on orange pball arms crossed      Lumbar Exercises: Sidelying   Clam Right;Left;10 reps;3 seconds    Clam Limitations green TB    Hip Abduction Right;Left;10 reps;Weights    Hip Abduction Weights (lbs) 2    Other Sidelying Lumbar Exercises fire hydrants R/L 10 reps      Manual Therapy   Manual Therapy Soft tissue mobilization;Myofascial release    Soft tissue mobilization STM to B lumbar paraspinals    Myofascial Release manual TPR and pin & stretch to L thoracolumbar paraspinals                    PT Short Term Goals - 04/30/21 0809      PT SHORT TERM GOAL #1   Title Patient will be independent with initial HEP    Status Achieved  04/09/21     PT SHORT TERM GOAL #2   Title Patient will verbalize/demonstrate understanding of neutral spine posture and proper body mechanics to reduce strain on lumbar spine    Status Achieved   04/11/21            PT Long Term Goals - 05/02/21 0849      PT LONG TERM GOAL #1   Title Patient will be independent with ongoing/advanced HEP +/- gym program for self-management at home    Status Partially Met      PT [place] #2   Title Patient to demonstrate appropriate posture and body mechanics needed for daily activities    Status Achieved   04/30/21     PT LONG TERM GOAL #3   Title Decrease lumbar pain by >/= 50-75% allowing patient increased ease of positional and activity tolerance    Status Achieved   Reports 80% improvement and decrease in pain     PT LONG TERM GOAL #4   Title Patient to improve lumbar AROM to [facility] without pain provocation    Status Partially Met      PT LONG TERM GOAL #5   Title Patient will demonstrate improved B proximal LE strength to >/= 4 to 4+/5 for improved stability and ease of  mobility    Status On-going                 Plan - 05/02/21 0945    Clinical Impression Statement Pt reports the most relief from back pain doing prayer stretches with physioball and DKTC stretches. She reports that she is always in more pain and stiffness in the morning but it reduces throughout the day. She reports that she will play golf today. Focused on LE strength and lumbar stretching, she reported "pain" with the S/L hip ABD and fire hydrants but also noted she can't differentiate between pain and muscles being worked. She reported that the "pain" was more of the muscle being worked. Cueing required  to remain on her side during the S/L exercises and cues to keep control of the muscles during exercises. She reports being ready to transition to HEP after next visit.    Personal Factors and Comorbidities Age;Comorbidity 3+;Time since onset of injury/illness/exacerbation;Past/Current Experience    Comorbidities B knee pain, peripheral polyneuropathy, osteoporosis, HTN, hearing loss, GERD, heart murmur    PT Frequency 2x / week    PT Duration 6 weeks    PT Treatment/Interventions ADLs/Self Care Home Management;Cryotherapy;Electrical Stimulation;Iontophoresis 34m/ml Dexamethasone;Moist Heat;Traction;Ultrasound;Gait training;Stair training;Functional mobility training;Therapeutic activities;Therapeutic exercise;Balance training;Neuromuscular re-education;Patient/family education;Manual techniques;Passive range of motion;Dry needling;Taping;Spinal Manipulations    PT Next Visit Plan HEP review & consolidation; lumbopelvic flexibility & strengthening - progressing to upright positions; manual therapy as tolerated to address increased muscle tension in thoracolumbar paraspinals; review posture & body mechanics education PRN    Consulted and Agree with Plan of Care Patient           Patient will benefit from skilled therapeutic intervention in order to improve the following deficits and  impairments:  Decreased activity tolerance,Decreased balance,Decreased endurance,Decreased knowledge of precautions,Decreased mobility,Decreased range of motion,Decreased safety awareness,Decreased strength,Difficulty walking,Hypomobility,Increased fascial restricitons,Increased muscle spasms,Impaired perceived functional ability,Impaired flexibility,Improper body mechanics,Postural dysfunction,Pain  Visit Diagnosis: Acute bilateral low back pain without sciatica  Muscle spasm of back  Muscle weakness (generalized)  Difficulty in walking, not elsewhere classified  Unsteadiness on feet     Problem List Patient Active Problem List   Diagnosis Date Noted  .  Osteoporosis 08/31/2019  . Pre-diabetes 08/31/2019  . Essential hypertension 08/25/2018  . Peripheral polyneuropathy 08/25/2018  . Dyslipidemia 08/25/2018  . Bilateral hearing loss 08/25/2018    Artist Pais, PTA 05/02/2021, 9:54 AM  [facility] [address]  [place], [place], [address] Phone: [phone]   Fax:  [phone]  Name: Katrina Lucas MRN: [id number] Date of Birth: 07-Mar-1941

## 2021-05-06 ENCOUNTER — Other Ambulatory Visit: Payer: Self-pay

## 2021-05-06 ENCOUNTER — Ambulatory Visit: Payer: Medicare Other | Admitting: Physical Therapy

## 2021-05-06 ENCOUNTER — Encounter: Payer: Self-pay | Admitting: Physical Therapy

## 2021-05-06 DIAGNOSIS — R2681 Unsteadiness on feet: Secondary | ICD-10-CM

## 2021-05-06 DIAGNOSIS — M545 Low back pain, unspecified: Secondary | ICD-10-CM

## 2021-05-06 DIAGNOSIS — M6281 Muscle weakness (generalized): Secondary | ICD-10-CM

## 2021-05-06 DIAGNOSIS — M62830 Muscle spasm of back: Secondary | ICD-10-CM

## 2021-05-06 DIAGNOSIS — R262 Difficulty in walking, not elsewhere classified: Secondary | ICD-10-CM

## 2021-05-06 NOTE — Therapy (Addendum)
[facility] [address]  [place], [place], [address] Phone: [phone]   Fax:  [phone]  Physical Therapy Treatment / Progress Note / Discharge Summary  Patient Details  Name: Katrina Lucas MRN: [id number] Date of Birth: 15-Jun-1941 Referring Provider (PT): Lamar Blinks, MD  Progress Note  Reporting Period 04/30/2021 to 05/06/2021  See note below for Objective Data and Assessment of Progress/Goals.      Encounter Date: 05/06/2021   PT End of Session - 05/06/21 0804     Visit Number 11    Number of Visits 12    Date for PT Re-Evaluation 05/07/21    Authorization Type [employer] & [employer]    PT Start Time 0804    PT Stop Time 0844    PT Time Calculation (min) 40 min    Activity Tolerance Patient tolerated treatment well    Behavior During Therapy WFL for tasks assessed/performed             Past Medical History:  Diagnosis Date   Allergy    Arthritis    Chicken pox    Colon polyp    Diverticulitis    GERD (gastroesophageal reflux disease)    Heart murmur    Hyperlipidemia    Hypertension     History reviewed. No pertinent surgical history.  There were no vitals filed for this visit.   Subjective Assessment - 05/06/21 0808     Subjective Pt reports overall improvement with PT but still experiences pain upon rising in the morning that typically subsides as she gets moving during the day.    Diagnostic tests 03/11/21 Lumbar x-ray: 1. No acute osseous abnormality. 2. 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    Patient Stated Goals "to get some strengthening exercises to lessen the pain"    Currently in Pain? Yes    Pain Score 3     Pain Location Back    Pain Orientation Lower    Pain Descriptors / Indicators Other (Comment)   "feels like my back is going to break"   Pain Type Acute  pain    Pain Frequency Intermittent                [facility] PT Assessment - 05/06/21 0804       Assessment   Medical Diagnosis Lumbar pain    Referring Provider (PT) Lamar Blinks, MD    Onset Date/Surgical Date --   Jan 2022   Next MD Visit ~Sept 2020      Observation/Other Assessments   Focus on Therapeutic Outcomes (FOTO)  Lumbar: FS = 69 (8 pt improvement from eval)      AROM   Lumbar Flexion hands to lower shins    Lumbar Extension 25% limited    Lumbar - Right Side Bend hand to fibular head    Lumbar - Left Side Bend hand to fibular head - R LBP    Lumbar - Right Rotation [facility]    Lumbar - Left Rotation [facility]      Strength   Right Hip Flexion 4/5    Right Hip Extension 4/5    Right Hip External Rotation  4/5    Right Hip Internal Rotation 4+/5    Right Hip ABduction 4/5    Right Hip ADduction 4/5    Left Hip Flexion 4/5    Left Hip Extension 4/5  Left Hip External Rotation 4/5    Left Hip Internal Rotation 4+/5    Left Hip ABduction 4/5    Left Hip ADduction 4+/5                           [facility] Adult PT Treatment/Exercise - 05/06/21 0804       Exercises   Exercises Lumbar      Lumbar Exercises: Aerobic   Recumbent Bike L2 x 6 min      Lumbar Exercises: Seated   Long Arc Quad on [place] Both;10 reps    LAQ on [place] Limitations green Pball    Hip Flexion on Ball Both;10 reps    Hip Flexion on Ball Limitations green Pball    Other Seated Lumbar Exercises Red TB horizontal ABD & UE diagonals x 10 each - seated on green Pball                      PT Short Term Goals - 04/30/21 0809       PT SHORT TERM GOAL #1   Title Patient will be independent with initial HEP    Status Achieved   04/09/21     PT SHORT TERM GOAL #2   Title Patient will verbalize/demonstrate understanding of neutral spine posture and proper body mechanics to reduce strain on lumbar spine    Status Achieved   04/11/21              PT Long Term Goals  - 05/06/21 0813       PT LONG TERM GOAL #1   Title Patient will be independent with ongoing/advanced HEP +/- gym program for self-management at home    Status Achieved   05/06/21     PT LONG TERM GOAL #2   Title Patient to demonstrate appropriate posture and body mechanics needed for daily activities    Status Achieved   04/30/21     PT LONG TERM GOAL #3   Title Decrease lumbar pain by >/= 50-75% allowing patient increased ease of positional and activity tolerance    Status Achieved   Reports 80% improvement and decrease in pain     PT LONG TERM GOAL #4   Title Patient to improve lumbar AROM to [facility] without pain provocation    Status Partially Met      PT LONG TERM GOAL #5   Title Patient will demonstrate improved B proximal LE strength to >/= 4 to 4+/5 for improved stability and ease of mobility    Status Achieved   05/06/21                  Plan - 05/06/21 0844     Clinical Impression Statement Pat reports >80% improvement since start of PT, now typically only experiencing pain upon rising in the morning which subsides as she gets moving. She was able to play golf w/o limitation due to pain but does note that she was much more cautious with her motions than she would normally be. Her proximal LE strength continues to improve, and she is independent with an ongoing HEP. All goals met with exception of mildly limited lumbar extension ROM. [name] feels ready to try transitioning to her HEP but would like to remain on hold for 30-days in the event that issues arise that would necessitate a return to PT.    Comorbidities B knee pain, peripheral polyneuropathy, osteoporosis, HTN, hearing loss, GERD, heart  murmur    Rehab Potential Good    PT Treatment/Interventions ADLs/Self Care Home Management;Cryotherapy;Electrical Stimulation;Iontophoresis 51m/ml Dexamethasone;Moist Heat;Traction;Ultrasound;Gait training;Stair training;Functional mobility training;Therapeutic activities;Therapeutic  exercise;Balance training;Neuromuscular re-education;Patient/family education;Manual techniques;Passive range of motion;Dry needling;Taping;Spinal Manipulations    PT Next Visit Plan transition to HEP + 30-day hold    PT Home Exercise Plan MedBridge Access Codes: [email or web address] (Apr 14, 2023, [email or web address] (4/12, updated 4/25); [email or web address] (5/10)    Consulted and Agree with Plan of Care Patient             Patient will benefit from skilled therapeutic intervention in order to improve the following deficits and impairments:  Decreased activity tolerance,Decreased balance,Decreased endurance,Decreased knowledge of precautions,Decreased mobility,Decreased range of motion,Decreased safety awareness,Decreased strength,Difficulty walking,Hypomobility,Increased fascial restricitons,Increased muscle spasms,Impaired perceived functional ability,Impaired flexibility,Improper body mechanics,Postural dysfunction,Pain  Visit Diagnosis: Acute bilateral low back pain without sciatica  Muscle spasm of back  Muscle weakness (generalized)  Difficulty in walking, not elsewhere classified  Unsteadiness on feet     Problem List Patient Active Problem List   Diagnosis Date Noted   Osteoporosis 08/31/2019   Pre-diabetes 08/31/2019   Essential hypertension 08/25/2018   Peripheral polyneuropathy 08/25/2018   Dyslipidemia 08/25/2018   Bilateral hearing loss 08/25/2018    JPercival Spanish PT, MPT 05/06/2021, 12:54 PM  [facility] [address] [place] [address]Phone: [phone]  Fax:  [phone] Name: PMAHREEN SCHEWEMRN: [id number] of Birth: 630-Jun-1942  PHYSICAL THERAPY DISCHARGE SUMMARY  Visits from Start of Care: 11  Current functional level related to goals / functional outcomes:   Refer to above clinical impression for status as of last visit on 05/06/2021. Patient was placed on hold for 30 days and has not needed to  return to PT, therefore will proceed with discharge from PT for this episode.   Remaining deficits:   As above.    Education / Equipment:   HEP, pBiomedical scientisteducation   Patient agrees to discharge. Patient goals were mostly met. Patient is being discharged due to being pleased with the current functional level.   JPercival Spanish PT, MPT 06/28/21, 10:52 AM  [facility] [address]Phone: [phone]  Fax:  [phone]

## 2021-05-16 ENCOUNTER — Other Ambulatory Visit: Payer: Self-pay | Admitting: Family Medicine

## 2021-05-16 DIAGNOSIS — I10 Essential (primary) hypertension: Secondary | ICD-10-CM

## 2021-06-14 IMAGING — CT CT ABD-PELV W/ CM
2 of 5 series · 16 of 46 positions shown, 18 images · IV contrast (Omnipaque)
Comparison: None.

CLINICAL DATA: Abdominal distension and bloating for approximately
1 year.

EXAM:
CT ABDOMEN AND PELVIS WITH CONTRAST
TECHNIQUE: Multidetector CT imaging of the abdomen and pelvis was performed
using the standard protocol following bolus administration of
intravenous contrast.
CONTRAST:  100mL OMNIPAQUE IOHEXOL 300 MG/ML  SOLN

[Series 2: axial st · axial · 0.83mm/px · z∈[-468,-74]mm · 13 of 91 slices shown, 15 images]
[im 6/91  soft-tissue]
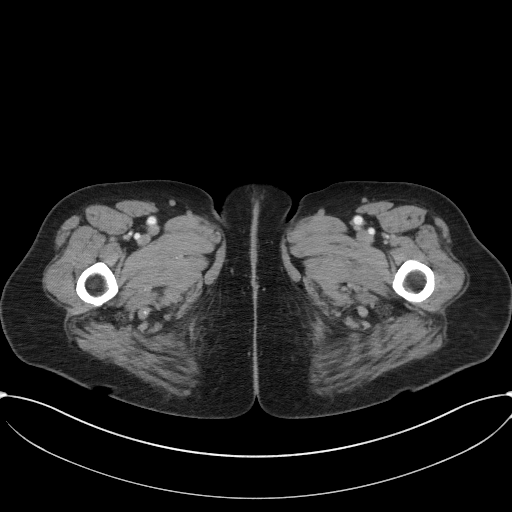
[im 6/91  bone]
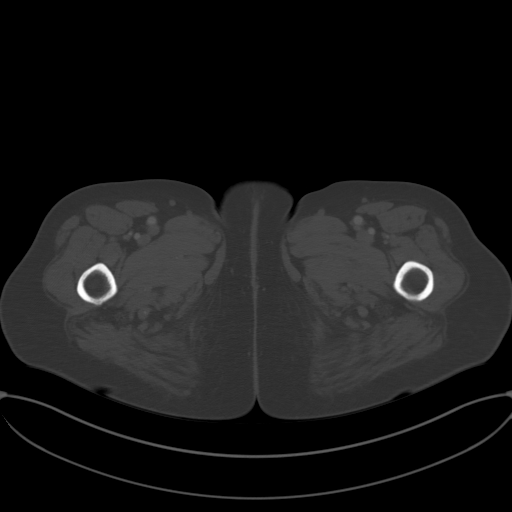
[im 11/91  soft-tissue]
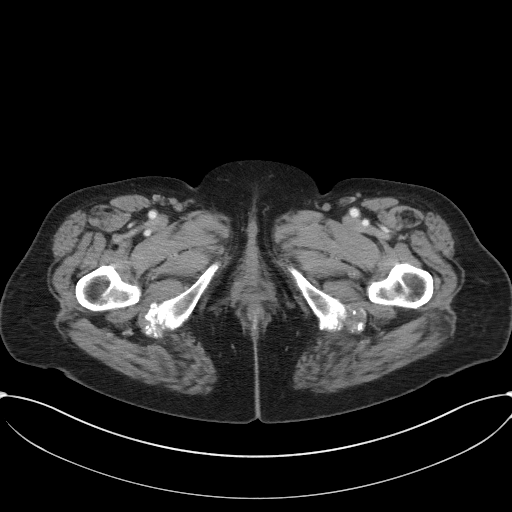
[im 22/91  soft-tissue]
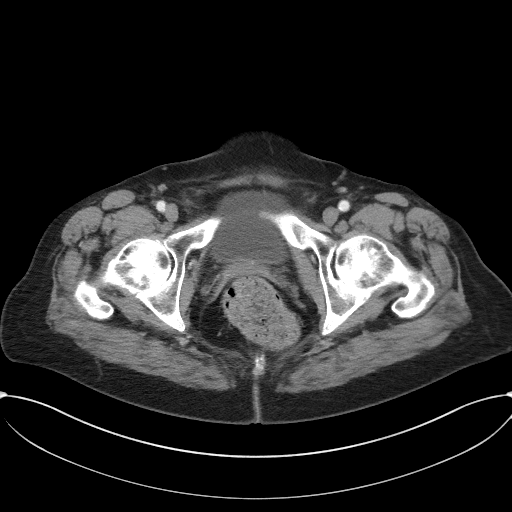
[im 27/91  soft-tissue]
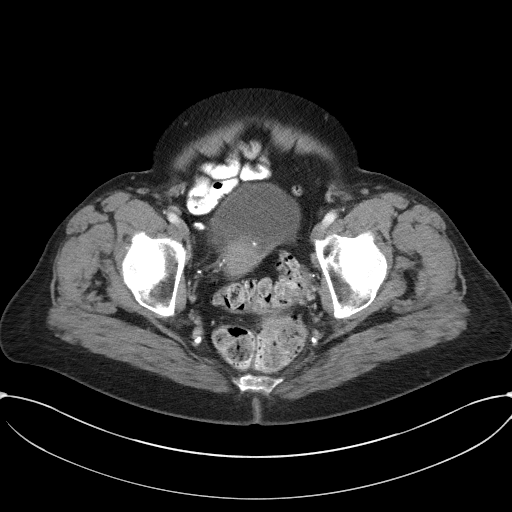
[im 32/91  soft-tissue]
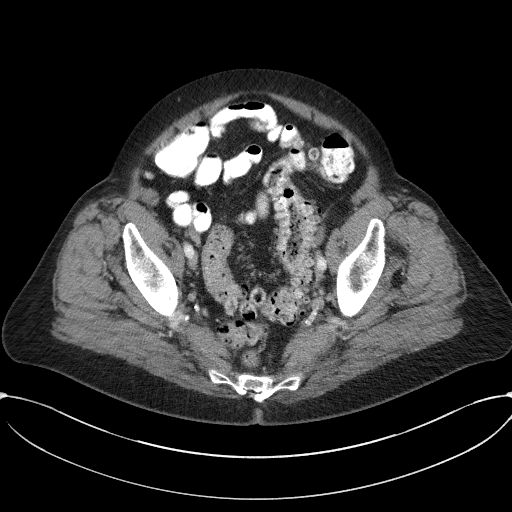
[im 38/91  soft-tissue]
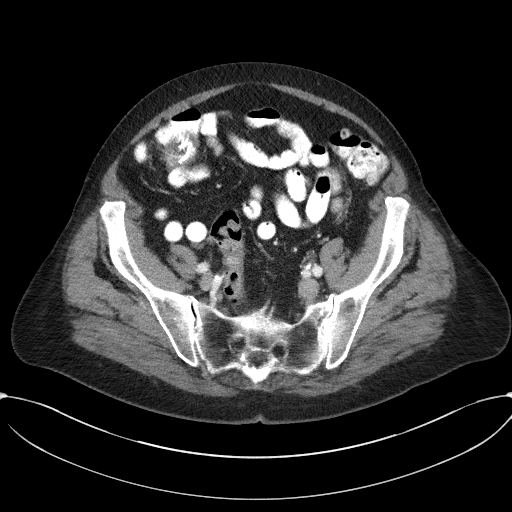
[im 48/91  soft-tissue]
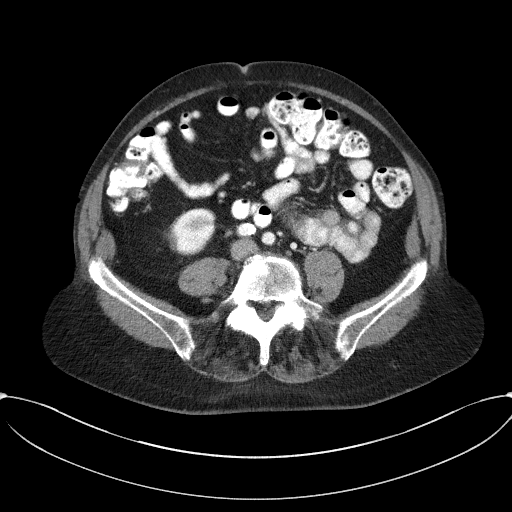
[im 53/91  soft-tissue]
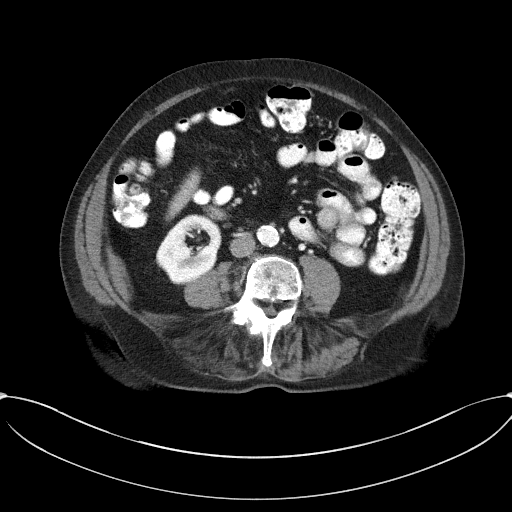
[im 59/91  soft-tissue]
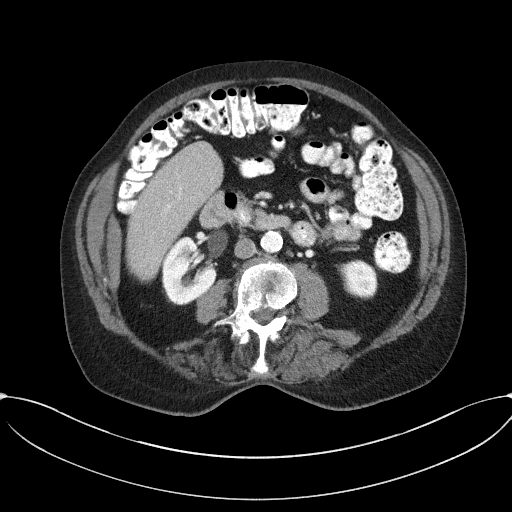
[im 59/91  bone]
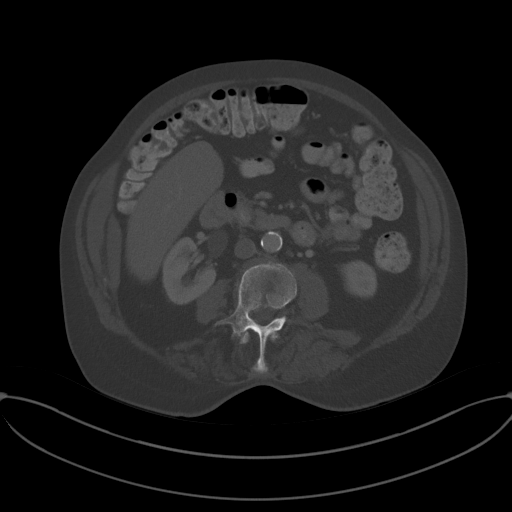
[im 64/91  soft-tissue]
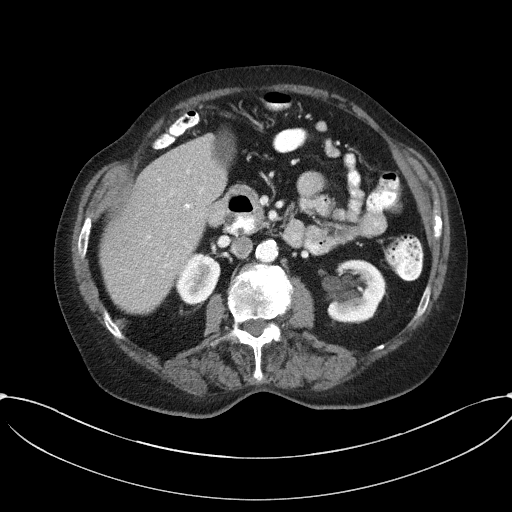
[im 69/91  soft-tissue]
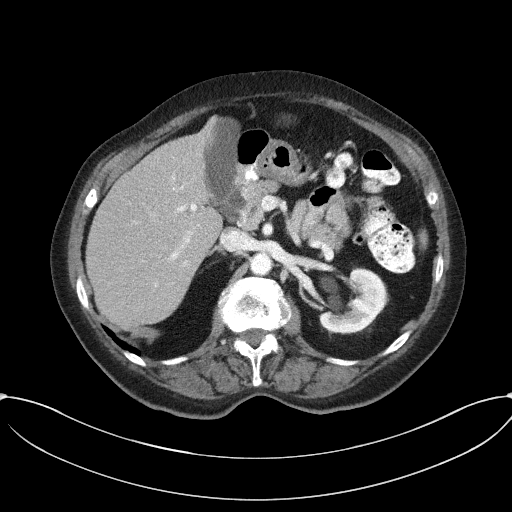
[im 80/91  soft-tissue]
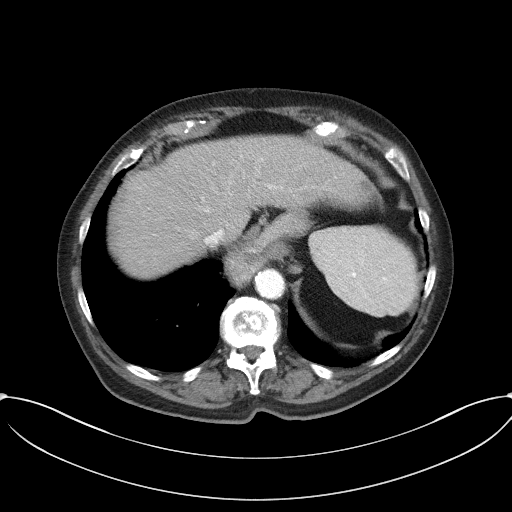
[im 85/91  soft-tissue]
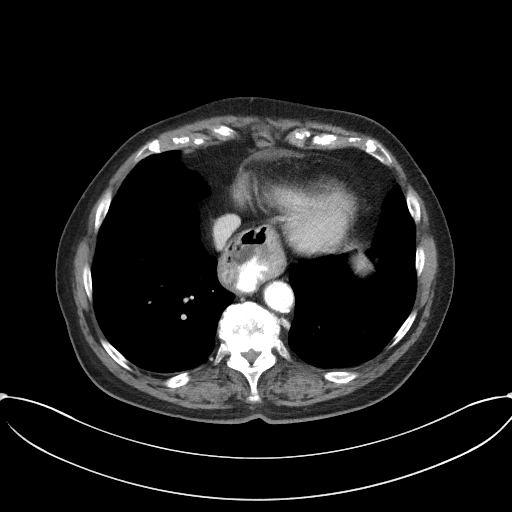

[Series 4: coronal st · coronal · 0.73mm/px · 3 of 106 slices shown]
[im 36/106  soft-tissue]
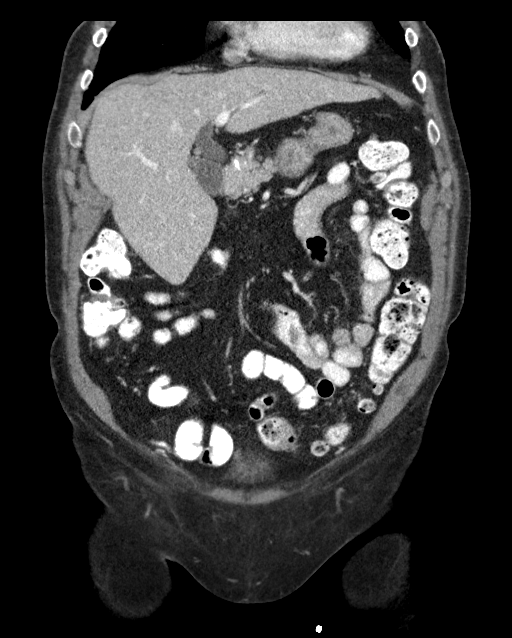
[im 47/106  soft-tissue]
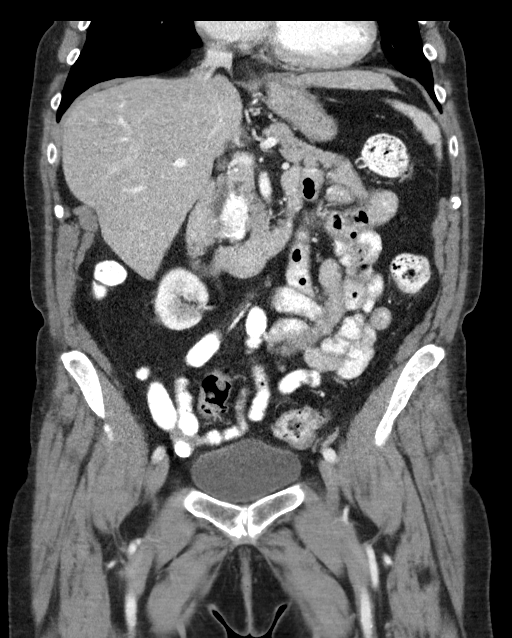
[im 59/106  soft-tissue]
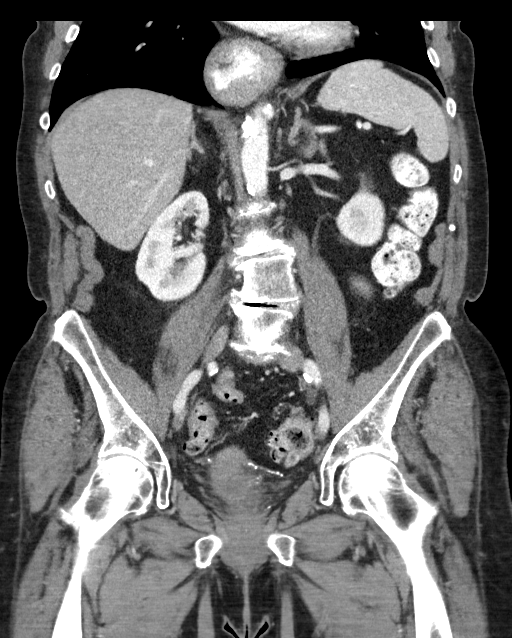

[16 of 46 positions shown; findings below may reference images not displayed]

FINDINGS: Lower Chest: No acute findings.

Hepatobiliary: No hepatic masses identified. Gallbladder is
unremarkable. No evidence of biliary ductal dilatation.

Pancreas:  No mass or inflammatory changes.

Spleen: Within normal limits in size and appearance.

Adrenals/Urinary Tract: No masses identified. No evidence of
ureteral calculi or hydronephrosis.

Stomach/Bowel: Moderate hiatal hernia is seen. 3.5 cm duodenal
diverticulum incidentally noted. No evidence of obstruction,
inflammatory process or abnormal fluid collections. Normal appendix
visualized. Diverticulosis is seen mainly involving the sigmoid
colon, however there is no evidence of diverticulitis.

Vascular/Lymphatic: No pathologically enlarged lymph nodes. No
abdominal aortic aneurysm. Aortic atherosclerotic calcification
noted.

Reproductive: 1 cm calcified fibroid seen in the left uterine
fundus.

Other:  None.

Musculoskeletal:  No suspicious bone lesions identified.
IMPRESSION: No acute findings within the abdomen or pelvis.

Moderate hiatal hernia.

Colonic diverticulosis, without radiographic evidence of
diverticulitis.

1 cm calcified uterine fibroid.

Aortic Atherosclerosis ([id number]-L01.1).

## 2021-06-26 ENCOUNTER — Other Ambulatory Visit: Payer: Self-pay | Admitting: Family Medicine

## 2021-06-26 DIAGNOSIS — M810 Age-related osteoporosis without current pathological fracture: Secondary | ICD-10-CM

## 2021-07-17 ENCOUNTER — Emergency Department (HOSPITAL_BASED_OUTPATIENT_CLINIC_OR_DEPARTMENT_OTHER): Payer: Medicare Other

## 2021-07-17 ENCOUNTER — Encounter (HOSPITAL_BASED_OUTPATIENT_CLINIC_OR_DEPARTMENT_OTHER): Payer: Self-pay | Admitting: Emergency Medicine

## 2021-07-17 ENCOUNTER — Other Ambulatory Visit: Payer: Self-pay

## 2021-07-17 ENCOUNTER — Emergency Department (HOSPITAL_BASED_OUTPATIENT_CLINIC_OR_DEPARTMENT_OTHER)
Admission: EM | Admit: 2021-07-17 | Discharge: 2021-07-17 | Disposition: A | Discharge status: Home / Self Care | Payer: Medicare Other | Attending: Emergency Medicine | Admitting: Emergency Medicine

## 2021-07-17 DIAGNOSIS — M1120 Other chondrocalcinosis, unspecified site: Secondary | ICD-10-CM | POA: Diagnosis not present

## 2021-07-17 DIAGNOSIS — M25561 Pain in right knee: Secondary | ICD-10-CM | POA: Diagnosis present

## 2021-07-17 DIAGNOSIS — M1711 Unilateral primary osteoarthritis, right knee: Secondary | ICD-10-CM | POA: Insufficient documentation

## 2021-07-17 DIAGNOSIS — Z79899 Other long term (current) drug therapy: Secondary | ICD-10-CM | POA: Diagnosis not present

## 2021-07-17 DIAGNOSIS — I10 Essential (primary) hypertension: Secondary | ICD-10-CM | POA: Diagnosis not present

## 2021-07-17 NOTE — ED Triage Notes (Signed)
Patient complaining of pain and a "catch" in right knee. Does not endorse injury. Reports long term arthritis "everywhere".

## 2021-07-17 NOTE — ED Provider Notes (Signed)
[facility]ERG[facility] DEPARTMENT Provider Note   CSN: [id number] Arrival date & time: 07/17/21  [id number]     History Chief Complaint  Patient presents with   Knee Pain    Katrina Lucas is a 80 y.o. female.  Is located the lateral aspect of the knee and described as a catch.  She stated that when she got up in the middle of the night to go to the bathroom, the pain was severe.  She has been using a cane.  The history is provided by the patient.  Knee Pain Location:  Knee Time since incident:  2 days Injury: no   Knee location:  R knee Pain details:    Quality:  Aching and throbbing   Radiates to:  Does not radiate   Severity:  Severe   Onset quality:  Sudden   Duration:  2 days   Timing:  Constant   Progression:  Unchanged Chronicity:  New Dislocation: no   Prior injury to area:  No Relieved by:  Rest Worsened by:  Bearing weight Ineffective treatments:  Acetaminophen Associated symptoms: no back pain, no decreased ROM, no fever, no numbness, no swelling and no tingling       Past Medical History:  Diagnosis Date   Allergy    Arthritis    Chicken pox    Colon polyp    Diverticulitis    GERD (gastroesophageal reflux disease)    Heart murmur    Hyperlipidemia    Hypertension     Patient Active Problem List   Diagnosis Date Noted   Osteoporosis 08/31/2019   Pre-diabetes 08/31/2019   Essential hypertension 08/25/2018   Peripheral polyneuropathy 08/25/2018   Dyslipidemia 08/25/2018   Bilateral hearing loss 08/25/2018    History reviewed. No pertinent surgical history.   OB History   No obstetric history on file.     History reviewed. No pertinent family history.  Social History   Tobacco Use   Smoking status: Never   Smokeless tobacco: Never  Substance Use Topics   Alcohol use: Yes    Comment: 5 times a week   Drug use: Never    Home Medications Prior to Admission medications   Medication Sig Start Date End Date Taking?  Authorizing Provider  acetaminophen (TYLENOL) 325 MG tablet Take 650 mg by mouth.     [provider]  acetaminophen (TYLENOL) 325 MG tablet TAKE  BY MOUTH AS DIRECTED BY YOUR MEDICAL PROVIDER AS NEEDED 03/26/20   [provider]  alendronate (FOSAMAX) 70 MG tablet TAKE 1 TABLET EVERY 7 DAYS WITH A FULL GLASS OF WATER ON AN EMPTY STOMACH 06/26/21   Copland, Gay Filler, MD  amLODipine (NORVASC) 2.5 MG tablet TAKE 1 TABLET DAILY AS     NEEDED IF SYSTOLIC BLOOD   PRESSURE OVER 140 05/17/21   Copland, Gay Filler, MD  cetirizine (ZYRTEC) 10 MG tablet Take 10 mg by mouth daily.    [provider]  DOCOSAHEXAENOIC ACID PO Take by mouth.    [provider]  doxycycline (VIBRAMYCIN) 100 MG capsule Take 1 capsule (100 mg total) by mouth 2 (two) times daily. Patient not taking: Reported on 03/26/2021 03/11/21   Copland, Gay Filler, MD  LORazepam (ATIVAN) 0.5 MG tablet Take 1 tablet (0.5 mg total) by mouth as needed (dizziness). 09/17/20   Copland, Gay Filler, MD  losartan-hydrochlorothiazide (HYZAAR) 100-12.5 MG tablet TAKE 1 TABLET DAILY 09/13/20   Copland, Gay Filler, MD  Multiple Vitamins-Minerals (CENTRUM SILVER 50+WOMEN  PO) Take by mouth.    [provider]  naproxen sodium (ALEVE) 220 MG tablet Take 220 mg by mouth.    [provider]  ondansetron (ZOFRAN) 8 MG tablet Take 1 tablet (8 mg total) by mouth every 8 (eight) hours as needed for nausea or vomiting. 09/17/20   Copland, Gay Filler, MD  rosuvastatin (CRESTOR) 10 MG tablet TAKE 1 TABLET DAILY 03/15/21   Copland, Gay Filler, MD  UNABLE TO FIND Med Name: Align GI one capsule daily    [provider]  vitamin B-12 (CYANOCOBALAMIN) 1000 MCG tablet Take by mouth.    [provider]    Allergies    Sulfa antibiotics, Aspirin, and Codeine  Review of Systems   Review of Systems  Constitutional:  Negative for chills and fever.  HENT:  Negative for ear pain and sore throat.   Eyes:  Negative for  pain and visual disturbance.  Respiratory:  Negative for cough and shortness of breath.   Cardiovascular:  Negative for chest pain and palpitations.  Gastrointestinal:  Negative for abdominal pain and vomiting.  Genitourinary:  Negative for dysuria and hematuria.  Musculoskeletal:  Negative for arthralgias and back pain.  Skin:  Negative for color change and rash.  Neurological:  Negative for seizures and syncope.  All other systems reviewed and are negative.  Physical Exam Updated Vital Signs BP (!) 159/85 (BP Location: Right Arm)   Pulse 78   Temp 98.8 F (37.1 C) (Oral)   Resp 16   Ht '5\' 10"'$  (1.778 m)   Wt 79.8 kg   SpO2 98%   BMI 25.25 kg/m   Physical Exam Vitals and nursing note reviewed.  HENT:     Head: Normocephalic and atraumatic.  Eyes:     General: No scleral icterus. Pulmonary:     Effort: Pulmonary effort is normal. No respiratory distress.  Musculoskeletal:     Cervical back: Normal range of motion.     Comments: Right knee is normal to inspection.  There is no effusion.  Range of motion is normal.  There is minimal tenderness to palpation at the lateral aspect of the knee along the joint line.  McMurray test is positive for lateral pain and clicking.  No ligamentous instability noted.  The extremity is warm and well-perfused with intact sensation.  Skin:    General: Skin is warm and dry.  Neurological:     Mental Status: She is alert.  Psychiatric:        Mood and Affect: Mood normal.    ED Results / Procedures / Treatments   Labs (all labs ordered are listed, but only abnormal results are displayed) Labs Reviewed - No data to display  EKG None  Radiology No results found.  Procedures Procedures   Medications Ordered in ED Medications - No data to display  ED Course  I have reviewed the triage vital signs and the nursing notes.  Pertinent labs & imaging results that were available during my care of the patient were reviewed by me and  considered in my medical decision making (see chart for details).    MDM Rules/Calculators/A&P                           Katrina Lucas resents with atraumatic knee pain.  She has extensive osteoarthritis of the knee.  We discussed symptomatic management including ice, topical treatment, acetaminophen.  She has already instituted gait modification such as  a cane, and she plans to borrow a walker.  She was advised to stay active.  We also talked about a cortisone injection, but she did not have good relief with this in the past.  We talked about physical therapy and orthopedic follow-up. Final Clinical Impression(s) / ED Diagnoses Final diagnoses:  Primary osteoarthritis of right knee  Chondrocalcinosis    Rx / DC Orders ED Discharge Orders     None        Arnaldo Natal, MD 07/17/21 [phone]

## 2021-09-08 ENCOUNTER — Other Ambulatory Visit: Payer: Self-pay | Admitting: Family Medicine

## 2021-09-08 DIAGNOSIS — I10 Essential (primary) hypertension: Secondary | ICD-10-CM

## 2021-09-15 NOTE — Patient Instructions (Addendum)
It was great to see you again today-I will be in touch with your labs ASAP.  Assuming all is well, please see me in about 6 months Have a wonderful time on your trip!   Immunizations are up to date  I ordered your mammogram - please do this asap   Please try using OTC miralax once a day or a few times a week for constipation. If this is not helpful let's plan to either have you see Dr Shana Chute or we can do a repeat CT.

## 2021-09-15 NOTE — Progress Notes (Addendum)
[facility] [place], [place], [place] [address] [phone]  Date:  09/18/2021   Name:  Katrina Lucas   DOB:  03/25/1941   MRN:  [id number]  PCP:  Darreld Mclean, MD    Chief Complaint: Annual Exam (Concerns/ questions:  pt says she has been constipated, needs 6 scopolamine patches. /Flu shot today: 09/12/21/)   History of Present Illness:  Katrina Lucas is a 80 y.o. 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 She gets some care from the [place]  She had Zostavax and has declined Shingrix Flu vaccine- done on 9/22 COVID booster- done on 9/17 DEXA scan 10/21 Mammogram 2020- will order  Most recent labs done in March, BMP, lipids, B12, A1c, CBC  She has noted some constipation - for about 6-8 months She may have a BM every 3-4 days- hard to pass, large stools She added colace a week or so but so far not helping much  She did try taking some sort of gummy laxative a couple of times  No bleeding She may get some discomfort if she presses on her belly No diarrhea, no vomiting or nausea   She does colon every 5 years- Katrina Lucas.  Last was in 2020 Looking back, we did a CT abdomen pelvis in 10/21 due to abdominal bloating IMPRESSION: No acute findings within the abdomen or pelvis. Moderate hiatal hernia. Colonic diverticulosis, without radiographic evidence of diverticulitis. 1 cm calcified uterine fibroid.   Fosamax Amlodipine 2.5 Losartan/HCTZ Crestor Patient Active Problem List   Diagnosis Date Noted   Osteoporosis 08/31/2019   Pre-diabetes 08/31/2019   Essential hypertension 08/25/2018   Peripheral polyneuropathy  08/25/2018   Dyslipidemia 08/25/2018   Bilateral hearing loss 08/25/2018    Past Medical History:  Diagnosis Date   Allergy    Arthritis    Chicken pox    Colon polyp    Diverticulitis    GERD (gastroesophageal reflux disease)    Heart murmur    Hyperlipidemia    Hypertension     No past surgical history on file.  Social History   Tobacco Use   Smoking status: Never   Smokeless tobacco: Never  Substance Use Topics   Alcohol use: Yes    Comment: 5 times a week   Drug use: Never    No family history on file.  Allergies  Allergen Reactions   Sulfa Antibiotics Shortness Of Breath   Aspirin     Microscopic bleeding Microscopic bleeding    Codeine Nausea And Vomiting    Medication list has been reviewed and updated.  Current Outpatient Medications on File Prior to Visit  Medication Sig Dispense Refill   acetaminophen (TYLENOL) 325 MG tablet Take 650 mg by mouth.      alendronate (FOSAMAX) 70 MG tablet TAKE 1 TABLET EVERY 7 DAYS WITH A FULL GLASS OF WATER ON AN EMPTY STOMACH 12 tablet 3   amLODipine (NORVASC) 2.5 MG tablet TAKE 1 TABLET DAILY AS     NEEDED IF SYSTOLIC BLOOD   PRESSURE OVER 140 90 tablet 2   cetirizine (ZYRTEC) 10 MG tablet Take 10 mg by mouth daily.  DOCOSAHEXAENOIC ACID PO Take by mouth.     LORazepam (ATIVAN) 0.5 MG tablet Take 1 tablet (0.5 mg total) by mouth as needed (dizziness). 30 tablet 0   losartan-hydrochlorothiazide (HYZAAR) 100-12.5 MG tablet TAKE 1 TABLET DAILY 90 tablet 3   Multiple Vitamins-Minerals (CENTRUM SILVER 50+WOMEN PO) Take by mouth.     naproxen sodium (ALEVE) 220 MG tablet Take 220 mg by mouth.     ondansetron (ZOFRAN) 8 MG tablet Take 1 tablet (8 mg total) by mouth every 8 (eight) hours as needed for nausea or vomiting. 15 tablet 0   rosuvastatin (CRESTOR) 10 MG tablet TAKE 1 TABLET DAILY 90 tablet 2   UNABLE TO FIND Med Name: Align GI one capsule daily     vitamin B-12 (CYANOCOBALAMIN) 1000 MCG tablet Take by mouth.      No current facility-administered medications on file prior to visit.    Review of Systems:  As per HPI- otherwise negative.   Physical Examination: Vitals:   09/18/21 0933  BP: 132/74  Pulse: 66  Resp: 18  Temp: 97.7 F (36.5 C)  SpO2: 99%   Vitals:   09/18/21 0933  Weight: 166 lb 6.4 oz (75.5 kg)  Height: 5\' 10"  (1.778 m)   Body mass index is 23.88 kg/m. Ideal Body Weight: Weight in (lb) to have BMI = 25: 173.9  GEN: no acute distress.  Looks well, normal weight HEENT: Atraumatic, Normocephalic.  Ears and Nose: No external deformity. CV: RRR, No M/G/R. No JVD. No thrill. No extra heart sounds. PULM: CTA B, no wheezes, crackles, rhonchi. No retractions. No resp. distress. No accessory muscle use. ABD: S, NT, ND, +BS. No rebound. No HSM.  Belly is larger than expected for her body size otherwise, but soft with no masses EXTR: No c/c/e PSYCH: Normally interactive. Conversant.    Assessment and Plan: Essential hypertension - Plan: Comprehensive metabolic panel  Pre-diabetes - Plan: Hemoglobin A1c  Motion sickness, initial encounter - Plan: scopolamine (TRANSDERM-SCOP, 1.5 MG,) 1 MG/3DAYS  Encounter for screening mammogram for malignant neoplasm of breast - Plan: CANCELED: MM DIAG BREAST TOMO BILATERAL  Bowel habit changes  Will plan further follow- up pending labs. She will be going on a cruise in December, scopolamine patches to prevent motion sickness She also notes change in her bowel habits, no constipation.  On exam her abdomen seems more distended than expected for someone her size.  However, she had a CT scan for this concern less than 1 year ago.  For the time being we plan to try MiraLAX and see if this will resolve the problem.  If symptoms persist we can have her see GI, versus repeating a CT scan She will keep me posted Physical exam today.  Encouraged healthy diet and exercise routine  This visit occurred during the SARS-CoV-2 public health  emergency.  Safety protocols were in place, including screening questions prior to the visit, additional usage of staff PPE, and extensive cleaning of exam room while observing appropriate contact time as indicated for disinfecting solutions.   Signed Lamar Blinks, MD   Received labs as below, message to pt Results for orders placed or performed in visit on 09/18/21  Hemoglobin A1c  Result Value Ref Range   Hgb A1c MFr Bld 5.6 4.6 - 6.5 %  Comprehensive metabolic panel  Result Value Ref Range   Sodium 137 135 - 145 mEq/L   Potassium 3.8 3.5 - 5.1 mEq/L   Chloride 101 96 - 112 mEq/L  CO2 31 19 - 32 mEq/L   Glucose, Bld 92 70 - 99 mg/dL   BUN 19 6 - 23 mg/dL   Creatinine, Ser 0.79 0.40 - 1.20 mg/dL   Total Bilirubin 0.6 0.2 - 1.2 mg/dL   Alkaline Phosphatase 46 39 - 117 U/L   AST 17 0 - 37 U/L   ALT 14 0 - 35 U/L   Total Protein 7.2 6.0 - 8.3 g/dL   Albumin 4.5 3.5 - 5.2 g/dL   GFR 69.73 >60.00 mL/min   Calcium 10.3 8.4 - 10.5 mg/dL

## 2021-09-18 ENCOUNTER — Other Ambulatory Visit: Payer: Self-pay

## 2021-09-18 ENCOUNTER — Ambulatory Visit (INDEPENDENT_AMBULATORY_CARE_PROVIDER_SITE_OTHER): Payer: Medicare Other | Admitting: Family Medicine

## 2021-09-18 ENCOUNTER — Encounter: Payer: Self-pay | Admitting: Family Medicine

## 2021-09-18 ENCOUNTER — Other Ambulatory Visit (HOSPITAL_BASED_OUTPATIENT_CLINIC_OR_DEPARTMENT_OTHER): Payer: Self-pay

## 2021-09-18 VITALS — BP 132/74 | HR 66 | Temp 97.7°F | Resp 18 | Ht 70.0 in | Wt 166.4 lb

## 2021-09-18 DIAGNOSIS — R7303 Prediabetes: Secondary | ICD-10-CM | POA: Diagnosis not present

## 2021-09-18 DIAGNOSIS — R194 Change in bowel habit: Secondary | ICD-10-CM

## 2021-09-18 DIAGNOSIS — T753XXA Motion sickness, initial encounter: Secondary | ICD-10-CM

## 2021-09-18 DIAGNOSIS — Z1231 Encounter for screening mammogram for malignant neoplasm of breast: Secondary | ICD-10-CM

## 2021-09-18 DIAGNOSIS — I10 Essential (primary) hypertension: Secondary | ICD-10-CM

## 2021-09-18 LAB — COMPREHENSIVE METABOLIC PANEL
ALT: 14 U/L (ref 0–35)
AST: 17 U/L (ref 0–37)
Albumin: 4.5 g/dL (ref 3.5–5.2)
Alkaline Phosphatase: 46 U/L (ref 39–117)
BUN: 19 mg/dL (ref 6–23)
CO2: 31 mEq/L (ref 19–32)
Calcium: 10.3 mg/dL (ref 8.4–10.5)
Chloride: 101 mEq/L (ref 96–112)
Creatinine, Ser: 0.79 mg/dL (ref 0.40–1.20)
GFR: 69.73 mL/min (ref >60.00)
Glucose, Bld: 92 mg/dL (ref 70–99)
Potassium: 3.8 mEq/L (ref 3.5–5.1)
Sodium: 137 mEq/L (ref 135–145)
Total Bilirubin: 0.6 mg/dL (ref 0.2–1.2)
Total Protein: 7.2 g/dL (ref 6.0–8.3)

## 2021-09-18 LAB — HEMOGLOBIN A1C: Hgb A1c MFr Bld: 5.6 % (ref 4.6–6.5)

## 2021-09-18 MED ORDER — SCOPOLAMINE 1 MG/3DAYS TD PT72
1.0000 | MEDICATED_PATCH | TRANSDERMAL | 0 refills | Status: DC
  Filled 2021-09-18: qty 10, 30d supply, fill #0

## 2021-10-29 ENCOUNTER — Other Ambulatory Visit: Payer: Self-pay

## 2021-10-29 ENCOUNTER — Ambulatory Visit (HOSPITAL_BASED_OUTPATIENT_CLINIC_OR_DEPARTMENT_OTHER)
Admission: RE | Admit: 2021-10-29 | Discharge: 2021-10-29 | Disposition: A | Discharge status: Home / Self Care | Payer: Medicare Other | Source: Ambulatory Visit | Attending: Family Medicine | Admitting: Family Medicine

## 2021-10-29 ENCOUNTER — Encounter (HOSPITAL_BASED_OUTPATIENT_CLINIC_OR_DEPARTMENT_OTHER): Payer: Self-pay

## 2021-10-29 ENCOUNTER — Other Ambulatory Visit (HOSPITAL_BASED_OUTPATIENT_CLINIC_OR_DEPARTMENT_OTHER): Payer: Self-pay

## 2021-10-29 DIAGNOSIS — Z1231 Encounter for screening mammogram for malignant neoplasm of breast: Secondary | ICD-10-CM | POA: Diagnosis present

## 2021-10-29 MED ORDER — DOXYCYCLINE HYCLATE 100 MG PO CAPS
100.0000 mg | ORAL_CAPSULE | Freq: Two times a day (BID) | ORAL | 0 refills | Status: DC
  Filled 2021-10-29: qty 20, 10d supply, fill #0

## 2021-12-21 ENCOUNTER — Other Ambulatory Visit: Payer: Self-pay | Admitting: Family Medicine

## 2021-12-21 DIAGNOSIS — I10 Essential (primary) hypertension: Secondary | ICD-10-CM

## 2022-02-14 DIAGNOSIS — H524 Presbyopia: Secondary | ICD-10-CM | POA: Diagnosis not present

## 2022-02-18 ENCOUNTER — Ambulatory Visit (INDEPENDENT_AMBULATORY_CARE_PROVIDER_SITE_OTHER): Payer: Medicare Other

## 2022-02-18 ENCOUNTER — Other Ambulatory Visit: Payer: Self-pay

## 2022-02-18 DIAGNOSIS — Z0000 Encounter for general adult medical examination without abnormal findings: Secondary | ICD-10-CM

## 2022-02-18 NOTE — Progress Notes (Signed)
Virtual Visit via Telephone Note  I connected with  Katrina Lucas on 02/18/22 at  2:30 PM EST by telephone and verified that I am speaking with the correct person using two identifiers.  Medicare Annual Wellness visit completed telephonically due to Covid-19 pandemic.   Persons participating in this call: This Health Coach and this patient.   Location: Patient: Home Provider: Office   I discussed the limitations, risks, security and privacy concerns of performing an evaluation and management service by telephone and the availability of in person appointments. The patient expressed understanding and agreed to proceed.  Unable to perform video visit due to video visit attempted and failed and/or patient does not have video capability.   Some vital signs may be absent or patient reported.   Willette Brace, LPN   Subjective:   Katrina Lucas is a 81 y.o. female who presents for an Initial Medicare Annual Wellness Visit.  Review of Systems     Cardiac Risk Factors include: advanced age (>61men, >51 women);hypertension;dyslipidemia     Objective:    There were no vitals filed for this visit. There is no height or weight on file to calculate BMI.  Advanced Directives 02/18/2022 07/17/2021 03/26/2021  Does Patient Have a Medical Advance Directive? Yes No Yes  Type of Advance Directive Living will - [place];Living will  Does patient want to make changes to medical advance directive? - - No - Patient declined  Copy of [place] in Chart? - - No - copy requested  Would patient like information on creating a medical advance directive? - No - Patient declined -    Current Medications (verified) Outpatient Encounter Medications as of 02/18/2022  Medication Sig   acetaminophen (TYLENOL) 325 MG tablet Take 650 mg by mouth.    alendronate (FOSAMAX) 70 MG tablet TAKE 1 TABLET EVERY 7 DAYS WITH A FULL GLASS OF WATER ON AN EMPTY STOMACH    amLODipine (NORVASC) 2.5 MG tablet TAKE 1 TABLET DAILY AS     NEEDED IF SYSTOLIC BLOOD   PRESSURE OVER 140   cetirizine (ZYRTEC) 10 MG tablet Take 10 mg by mouth daily.   losartan-hydrochlorothiazide (HYZAAR) 100-12.5 MG tablet TAKE 1 TABLET DAILY   Multiple Vitamins-Minerals (CENTRUM SILVER 50+WOMEN PO) Take by mouth.   naproxen sodium (ALEVE) 220 MG tablet Take 220 mg by mouth.   rosuvastatin (CRESTOR) 10 MG tablet Take 10 mg by mouth daily.   vitamin B-12 (CYANOCOBALAMIN) 1000 MCG tablet Take by mouth.   doxycycline (VIBRAMYCIN) 100 MG capsule Take 1 capsule (100 mg total) by mouth 2 (two) times daily. (Patient not taking: Reported on 02/18/2022)   LORazepam (ATIVAN) 0.5 MG tablet Take 1 tablet (0.5 mg total) by mouth as needed (dizziness). (Patient not taking: Reported on 02/18/2022)   ondansetron (ZOFRAN) 8 MG tablet Take 1 tablet (8 mg total) by mouth every 8 (eight) hours as needed for nausea or vomiting. (Patient not taking: Reported on 02/18/2022)   [place] Name: Align GI one capsule daily (Patient not taking: Reported on 02/18/2022)   [DISCONTINUED] DOCOSAHEXAENOIC ACID PO Take by mouth.   [DISCONTINUED] rosuvastatin (CRESTOR) 10 MG tablet TAKE 1 TABLET DAILY   [DISCONTINUED] scopolamine (TRANSDERM-SCOP, 1.5 MG,) 1 MG/3DAYS Place 1 patch (1.5 mg total) onto the skin every 3 (three) days.   No facility-administered encounter medications on file as of 02/18/2022.    Allergies (verified) Sulfa antibiotics, Aspirin, and Codeine   History: Past Medical History:  Diagnosis Date   Allergy    Arthritis    Chicken pox    Colon polyp    Diverticulitis    GERD (gastroesophageal reflux disease)    Heart murmur    Hyperlipidemia    Hypertension    History reviewed. No pertinent surgical history. History reviewed. No pertinent family history. Social History   Socioeconomic History   Marital status: Single    Spouse name: Not on file   Number of children: Not on file    Years of education: Not on file   Highest education level: Not on file  Occupational History   Not on file  Tobacco Use   Smoking status: Never   Smokeless tobacco: Never  Substance and Sexual Activity   Alcohol use: Yes    Comment: 5 times a week   Drug use: Never   Sexual activity: Not on file  Other Topics Concern   Not on file  Social History Narrative   Not on file   Social Determinants of Health   Financial Resource Strain: Low Risk    Difficulty of Paying Living Expenses: Not hard at all  Food Insecurity: No Food Insecurity   Worried About Charity fundraiser in the Last Year: Never true   [place] in the Last Year: Never true  Transportation Needs: No Transportation Needs   Lack of Transportation (Medical): No   Lack of Transportation (Non-Medical): No  Physical Activity: Sufficiently Active   Days of Exercise per Week: 4 days   Minutes of Exercise per Session: 50 min  Stress: No Stress Concern Present   Feeling of Stress : Not at all  Social Connections: Moderately Isolated   Frequency of Communication with Friends and Family: More than three times a week   Frequency of Social Gatherings with Friends and Family: More than three times a week   Attends Religious Services: Never   Marine scientist or Organizations: Yes   Attends Music therapist: 1 to 4 times per year   Marital Status: Never married    Tobacco Counseling Counseling given: Not Answered   Clinical Intake:  Pre-visit preparation completed: Yes  Pain : No/denies pain     Diabetes: No  How often do you need to have someone help you when you read instructions, pamphlets, or other written materials from your doctor or pharmacy?: 1 - Never  Diabetic?No  Interpreter Needed?: No  Information entered by :: Charlott Rakes, LPN   Activities of Daily Living In your present state of health, do you have any difficulty performing the following activities: 02/18/2022   Hearing? Y  Comment wears hearing aids  Vision? N  Difficulty concentrating or making decisions? N  Walking or climbing stairs? N  Comment don't use stairs  Dressing or bathing? N  Doing errands, shopping? N  Preparing Food and eating ? N  Using the Toilet? N  In the past six months, have you accidently leaked urine? N  Do you have problems with loss of bowel control? N  Managing your Medications? N  Managing your Finances? N  Housekeeping or managing your Housekeeping? N  Some recent data might be hidden    Patient Care Team: Copland, Gay Filler, MD as PCP - General (Family Medicine)  Indicate any recent Medical Services you may have received from other than [facility] providers in the past year (date may be approximate).     Assessment:   This is a routine wellness examination  for [employer].  Hearing/Vision screen Hearing Screening - Comments:: Pt denies any hearing issues  Vision Screening - Comments:: Pt follows up with [facility] ophthalmology for annual eye exams    Dietary issues and exercise activities discussed: Current Exercise Habits: Home exercise routine, Type of exercise: Other - see comments, Time (Minutes): 50, Frequency (Times/Week): 4, Weekly Exercise (Minutes/Week): 200   Goals Addressed             This Visit's Progress    Patient Stated       None at this time        Depression Screen PHQ 2/9 Scores 02/18/2022 09/18/2021 09/17/2020 08/25/2018  PHQ - 2 Score 0 0 0 0    Fall Risk Fall Risk  02/18/2022 09/18/2021 09/17/2020 08/25/2018  Falls in the past year? 0 0 0 Yes  Number falls in past yr: 0 0 0 1  Injury with Fall? 0 0 0 No  Risk for fall due to : Impaired balance/gait;Impaired mobility;Impaired vision - - -  Follow up Falls prevention discussed - - -    FALL RISK PREVENTION PERTAINING TO THE HOME:  Any stairs in or around the home? Yes  If so, are there any without handrails? No  Home free of loose throw rugs in walkways, pet beds, electrical  cords, etc? Yes  Adequate lighting in your home to reduce risk of falls? Yes   ASSISTIVE DEVICES UTILIZED TO PREVENT FALLS:  Life alert? No  Use of a cane, walker or w/c? Yes  Grab bars in the bathroom? Yes  Shower chair or bench in shower? Yes  Elevated toilet seat or a handicapped toilet? No   TIMED UP AND GO:  Was the test performed? No .  Cognitive Function: Declined         Immunizations Immunization History  Administered Date(s) Administered   Influenza-Unspecified 09/13/2012, 09/24/2016, 09/13/2020   PFIZER(Purple Top)SARS-COV-2 Vaccination 01/12/2020, 02/02/2020, 05/01/2020, 09/07/2020, 09/07/2021   Pneumococcal Conjugate-13 09/02/2016   Pneumococcal Polysaccharide-23 08/25/2018   Td 03/11/2021   Tdap 11/21/2010   Zoster, Live 12/22/2013    TDAP status: Up to date  Flu Vaccine status: Up to date  Pneumococcal vaccine status: Up to date  Covid-19 vaccine status: Completed vaccines  Qualifies for Shingles Vaccine? Yes   Zostavax completed No   Shingrix Completed?: No.    Education has been provided regarding the importance of this vaccine. Patient has been advised to call insurance company to determine out of pocket expense if they have not yet received this vaccine. Advised may also receive vaccine at local pharmacy or Health Dept. Verbalized acceptance and understanding.  Screening Tests Health Maintenance  Topic Date Due   Zoster Vaccines- Shingrix (1 of 2) Never done   INFLUENZA VACCINE  07/22/2021   TETANUS/TDAP  03/12/2031   Pneumonia Vaccine 79+ Years old  Completed   DEXA SCAN  Completed   COVID-19 Vaccine  Completed   HPV VACCINES  Aged Out    Health Maintenance  Health Maintenance Due  Topic Date Due   Zoster Vaccines- Shingrix (1 of 2) Never done   INFLUENZA VACCINE  07/22/2021    Colorectal cancer screening: No longer required.   Mammogram status: Completed 01/08/22. Repeat every year  Bone Density status: Completed 12/09/19. Results  reflect: Bone density results: OSTEOPOROSIS. Repeat every 2 years.   Additional Screening:  Vision Screening: Recommended annual ophthalmology exams for early detection of glaucoma and other disorders of the eye. Is the patient up to date with  their annual eye exam?  Yes  Who is the provider or what is the name of the office in which the patient attends annual eye exams? [facility] opthalmology  If pt is not established with a provider, would they like to be referred to a provider to establish care? No .   Dental Screening: Recommended annual dental exams for proper oral hygiene  Community Resource Referral / Chronic Care Management: CRR required this visit?  No   CCM required this visit?  No      Plan:     I have personally reviewed and noted the following in the patients chart:   Medical and social history Use of alcohol, tobacco or illicit drugs  Current medications and supplements including opioid prescriptions. Patient is not currently taking opioid prescriptions. Functional ability and status Nutritional status Physical activity Advanced directives List of other physicians Hospitalizations, surgeries, and ER visits in previous 12 months Vitals Screenings to include cognitive, depression, and falls Referrals and appointments  In addition, I have reviewed and discussed with patient certain preventive protocols, quality metrics, and best practice recommendations. A written personalized care plan for preventive services as well as general preventive health recommendations were provided to patient.     Willette Brace, LPN   1/61/0960   Nurse Notes: None

## 2022-02-18 NOTE — Patient Instructions (Signed)
Katrina Lucas , Thank you for taking time to come for your Medicare Wellness Visit. I appreciate your ongoing commitment to your health goals. Please review the following plan we discussed and let me know if I can assist you in the future.   Screening recommendations/referrals: Colonoscopy: no longer required  Mammogram: Done 01/08/22 Bone Density: Done 12/09/19 repeat every 2 years  Recommended yearly ophthalmology/optometry visit for glaucoma screening and checkup Recommended yearly dental visit for hygiene and checkup  Vaccinations: Influenza vaccine: Done 12/12/21 Pneumococcal vaccine: Up to date Tdap vaccine: Done 03/11/21 Shingles vaccine: Shingrix discussed. Please contact your pharmacy for coverage information.    Covid-19:Completed 1/21, 2/11, 5/1, 09/07/20  Advanced directives: Please bring a copy of your health care power of attorney and living will to the office at your convenience.  Conditions/risks identified: None at this time   Next appointment: Follow up in one year for your annual wellness visit    Preventive Care 65 Years and Older, Female Preventive care refers to lifestyle choices and visits with your health care provider that can promote health and wellness. What does preventive care include? A yearly physical exam. This is also called an annual well check. Dental exams once or twice a year. Routine eye exams. Ask your health care provider how often you should have your eyes checked. Personal lifestyle choices, including: Daily care of your teeth and gums. Regular physical activity. Eating a healthy diet. Avoiding tobacco and drug use. Limiting alcohol use. Practicing safe sex. Taking low-dose aspirin every day. Taking vitamin and mineral supplements as recommended by your health care provider. What happens during an annual well check? The services and screenings done by your health care provider during your annual well check will depend on your age, overall  health, lifestyle risk factors, and family history of disease. Counseling  Your health care provider may ask you questions about your: Alcohol use. Tobacco use. Drug use. Emotional well-being. Home and relationship well-being. Sexual activity. Eating habits. History of falls. Memory and ability to understand (cognition). Work and work Statistician. Reproductive health. Screening  You may have the following tests or measurements: Height, weight, and BMI. Blood pressure. Lipid and cholesterol levels. These may be checked every 5 years, or more frequently if you are over 52 years old. Skin check. Lung cancer screening. You may have this screening every year starting at age 38 if you have a 30-pack-year history of smoking and currently smoke or have quit within the past 15 years. Fecal occult blood test (FOBT) of the stool. You may have this test every year starting at age 42. Flexible sigmoidoscopy or colonoscopy. You may have a sigmoidoscopy every 5 years or a colonoscopy every 10 years starting at age 42. Hepatitis C blood test. Hepatitis B blood test. Sexually transmitted disease (STD) testing. Diabetes screening. This is done by checking your blood sugar (glucose) after you have not eaten for a while (fasting). You may have this done every 1-3 years. Bone density scan. This is done to screen for osteoporosis. You may have this done starting at age 20. Mammogram. This may be done every 1-2 years. Talk to your health care provider about how often you should have regular mammograms. Talk with your health care provider about your test results, treatment options, and if necessary, the need for more tests. Vaccines  Your health care provider may recommend certain vaccines, such as: Influenza vaccine. This is recommended every year. Tetanus, diphtheria, and acellular pertussis (Tdap, Td) vaccine. You may need  a Td booster every 10 years. Zoster vaccine. You may need this after age  65. Pneumococcal 13-valent conjugate (PCV13) vaccine. One dose is recommended after age 30. Pneumococcal polysaccharide (PPSV23) vaccine. One dose is recommended after age 50. Talk to your health care provider about which screenings and vaccines you need and how often you need them. This information is not intended to replace advice given to you by your health care provider. Make sure you discuss any questions you have with your health care provider. Document Released: 01/04/2016 Document Revised: 08/27/2016 Document Reviewed: 10/09/2015 Elsevier Interactive Patient Education  2017 [employer] Prevention in the Home Falls can cause injuries. They can happen to people of all ages. There are many things you can do to make your home safe and to help prevent falls. What can I do on the outside of my home? Regularly fix the edges of walkways and driveways and fix any cracks. Remove anything that might make you trip as you walk through a door, such as a raised step or threshold. Trim any bushes or trees on the path to your home. Use bright outdoor lighting. Clear any walking paths of anything that might make someone trip, such as rocks or tools. Regularly check to see if handrails are loose or broken. Make sure that both sides of any steps have handrails. Any raised decks and porches should have guardrails on the edges. Have any leaves, snow, or ice cleared regularly. Use sand or salt on walking paths during winter. Clean up any spills in your garage right away. This includes oil or grease spills. What can I do in the bathroom? Use night lights. Install grab bars by the toilet and in the tub and shower. Do not use towel bars as grab bars. Use non-skid mats or decals in the tub or shower. If you need to sit down in the shower, use a plastic, non-slip stool. Keep the floor dry. Clean up any water that spills on the floor as soon as it happens. Remove soap buildup in the tub or shower  regularly. Attach bath mats securely with double-sided non-slip rug tape. Do not have throw rugs and other things on the floor that can make you trip. What can I do in the bedroom? Use night lights. Make sure that you have a light by your bed that is easy to reach. Do not use any sheets or blankets that are too big for your bed. They should not hang down onto the floor. Have a firm chair that has side arms. You can use this for support while you get dressed. Do not have throw rugs and other things on the floor that can make you trip. What can I do in the kitchen? Clean up any spills right away. Avoid walking on wet floors. Keep items that you use a lot in easy-to-reach places. If you need to reach something above you, use a strong step stool that has a grab bar. Keep electrical cords out of the way. Do not use floor polish or wax that makes floors slippery. If you must use wax, use non-skid floor wax. Do not have throw rugs and other things on the floor that can make you trip. What can I do with my stairs? Do not leave any items on the stairs. Make sure that there are handrails on both sides of the stairs and use them. Fix handrails that are broken or loose. Make sure that handrails are as long as the stairways. Check  any carpeting to make sure that it is firmly attached to the stairs. Fix any carpet that is loose or worn. Avoid having throw rugs at the top or bottom of the stairs. If you do have throw rugs, attach them to the floor with carpet tape. Make sure that you have a light switch at the top of the stairs and the bottom of the stairs. If you do not have them, ask someone to add them for you. What else can I do to help prevent falls? Wear shoes that: Do not have high heels. Have rubber bottoms. Are comfortable and fit you well. Are closed at the toe. Do not wear sandals. If you use a stepladder: Make sure that it is fully opened. Do not climb a closed stepladder. Make sure that  both sides of the stepladder are locked into place. Ask someone to hold it for you, if possible. Clearly mark and make sure that you can see: Any grab bars or handrails. First and last steps. Where the edge of each step is. Use tools that help you move around (mobility aids) if they are needed. These include: Canes. Walkers. Scooters. Crutches. Turn on the lights when you go into a dark area. Replace any light bulbs as soon as they burn out. Set up your furniture so you have a clear path. Avoid moving your furniture around. If any of your floors are uneven, fix them. If there are any pets around you, be aware of where they are. Review your medicines with your doctor. Some medicines can make you feel dizzy. This can increase your chance of falling. Ask your doctor what other things that you can do to help prevent falls. This information is not intended to replace advice given to you by your health care provider. Make sure you discuss any questions you have with your health care provider. Document Released: 10/04/2009 Document Revised: 05/15/2016 Document Reviewed: 01/12/2015 Elsevier Interactive Patient Education  2017 [employer].

## 2022-02-26 NOTE — Patient Instructions (Addendum)
It was great to see you again today, I will be in touch with your labs.  Assuming all is well please see me in about 6 months ? ?Have a wonderful spring- I hope you get to play lots of golf!  ?We can repeat your bone density this fall ? ? ?

## 2022-02-26 NOTE — Progress Notes (Addendum)
Therapist, music at [facility] ?[address], [address] ?[place], [place] [address] ?336 [phone] ?Fax [phone] ? ?Date:  03/03/2022  ? ?Name:  Katrina Lucas   DOB:  Dec 15, 1941   MRN:  [id number] ? ?PCP:  Katrina Mclean, MD  ? ? ?Chief Complaint: 6 month follow up (Concerns/ questions: /Shingrix: None in ncir) ? ? ?History of Present Illness: ? ?Katrina Lucas is a 81 y.o. very pleasant female patient who presents with the following: ? ?Patient seen today for periodic follow-up visit ?Most recently seen by myself in September ? ?History of HTN, hyperlipidemia, polyneuropathy, mild prediabetes, occasional vertigo/ BPPV for which she may use lorazepam ?Her vertigo has been silent for 2 years which is great news ?  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  I had her try some scheduled MiraLAX ?She notes her constipation has resolved ?She is getting back into golf for the spring season and is at the gym a few times a week ?She notes that she continues to have back pain, but exercise does help and she might take aleve twice a week and uses tylenol daily to keep things under control  ? ?Has declined Shingrix ?Flu shot UTD ?Labs done in September-CMP, A1c ?Can update lipids and CBC today ? ?Taking fosamax still- we will get her bone density this fall to check on her progress  ?Pt notes no falls ? ?Patient Active Problem List  ? Diagnosis Date Noted  ? Osteoporosis 08/31/2019  ? Pre-diabetes 08/31/2019  ? Essential hypertension 08/25/2018  ? Peripheral polyneuropathy 08/25/2018  ? Dyslipidemia 08/25/2018  ? 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 ? ?Past Medical History:  ?Diagnosis Date  ? Allergy   ? Arthritis   ? Chicken pox   ? Colon polyp   ? Diverticulitis   ? GERD (gastroesophageal reflux disease)   ? Heart murmur   ? Hyperlipidemia   ? Hypertension   ? ? ?No past surgical  history on file. ? ?Social History  ? ?Tobacco Use  ? Smoking status: Never  ? Smokeless tobacco: Never  ?Substance Use Topics  ? Alcohol use: Yes  ?  Comment: 5 times a week  ? Drug use: Never  ? ? ?No family history on file. ? ?Allergies  ?Allergen Reactions  ? Sulfa Antibiotics Shortness Of Breath  ? Aspirin   ?  Microscopic bleeding ?Microscopic bleeding ?  ? Codeine Nausea And Vomiting  ? ? ?Medication list has been reviewed and updated. ? ?Current Outpatient Medications on File Prior to Visit  ?Medication Sig Dispense Refill  ? rosuvastatin (CRESTOR) 10 MG tablet Take 1 tablet (10 mg total) by mouth daily. 90 tablet 3  ? acetaminophen (TYLENOL) 325 MG tablet Take 650 mg by mouth.     ? alendronate (FOSAMAX) 70 MG tablet TAKE 1 TABLET EVERY 7 DAYS WITH A FULL GLASS OF WATER ON AN EMPTY STOMACH 12 tablet 3  ? amLODipine (NORVASC) 2.5 MG tablet TAKE 1 TABLET DAILY AS     NEEDED IF SYSTOLIC BLOOD   PRESSURE OVER 140 90 tablet 2  ? cetirizine (ZYRTEC) 10 MG tablet Take 10 mg by mouth daily.    ? LORazepam (ATIVAN) 0.5 MG tablet Take 1 tablet (0.5 mg total) by mouth as needed (dizziness). (Patient not taking: Reported on 02/18/2022) 30 tablet 0  ?  losartan-hydrochlorothiazide (HYZAAR) 100-12.5 MG tablet TAKE 1 TABLET DAILY 90 tablet 3  ? Multiple Vitamins-Minerals (CENTRUM SILVER 50+WOMEN PO) Take by mouth.    ? ondansetron (ZOFRAN) 8 MG tablet Take 1 tablet (8 mg total) by mouth every 8 (eight) hours as needed for nausea or vomiting. (Patient not taking: Reported on 02/18/2022) 15 tablet 0  ? UNABLE TO FIND Med Name: Align GI one capsule daily (Patient not taking: Reported on 02/18/2022)    ? vitamin B-12 (CYANOCOBALAMIN) 1000 MCG tablet Take by mouth.    ? ?No current facility-administered medications on file prior to visit.  ? ? ?Review of Systems: ? ?,As per HPI- otherwise negative. ? ? ?Physical Examination: ?Vitals:  ? 03/03/22 0815  ?BP: 124/62  ?Pulse: 72  ?Resp: 18  ?Temp: 98.3 ?F (36.8 ?C)  ?SpO2: 99%   ? ?Vitals:  ? 03/03/22 0815  ?Weight: 167 lb 3.2 oz (75.8 kg)  ?Height: '5\' 10"'$  (1.778 m)  ? ?Body mass index is 23.99 kg/m?. ?Ideal Body Weight: Weight in (lb) to have BMI = 25: 173.9 ? ?GEN: no acute distress. Normal weight, looks very well for age  ?HEENT: Atraumatic, Normocephalic.  ?Ears and Nose: No external deformity. ?CV: RRR, No M/G/R. No JVD. No thrill. No extra heart sounds. ?PULM: CTA B, no wheezes, crackles, rhonchi. No retractions. No resp. distress. No accessory muscle use. ?ABD: S, NT, ND. No rebound. No HSM. ?EXTR: No c/c/e ?PSYCH: Normally interactive. Conversant.  ?She notes some discomfort in her lumbar spine with extension. Flexion is normal.  No discomfort with palpation over her back  ? ?Assessment and Plan: ?Pre-diabetes - Plan: Hemoglobin A1c ? ?Dyslipidemia - Plan: Lipid panel ? ?Essential hypertension - Plan: CBC, Basic metabolic panel ? ?Chronic bilateral low back pain without sciatica ? ?Age related osteoporosis, unspecified pathological fracture presence ?Following up today ?Labs are pending as above ?She continues to use fosamax for her osteoporosis- no major SE ?Recheck bone density this fall ?Lower back pain continues to be under adequate control with daily tylenol, prn aleve and exercise ?BP under good control with current meds, continue ?Continue crestor ?Will plan further follow- up pending labs. ? ? ?Signed ?Katrina Blinks, MD ? ?Received labs as below, message to pt ? ?Results for orders placed or performed in visit on 03/03/22  ?CBC  ?Result Value Ref Range  ? WBC 8.3 4.0 - 10.5 K/uL  ? RBC 4.68 3.87 - 5.11 Mil/uL  ? Platelets 267.0 150.0 - 400.0 K/uL  ? Hemoglobin 14.1 12.0 - 15.0 g/dL  ? HCT 42.2 36.0 - 46.0 %  ? MCV 90.2 78.0 - 100.0 fl  ? MCHC 33.5 30.0 - 36.0 g/dL  ? RDW 12.9 11.5 - 15.5 %  ?Basic metabolic panel  ?Result Value Ref Range  ? Sodium 138 135 - 145 mEq/L  ? Potassium 3.9 3.5 - 5.1 mEq/L  ? Chloride 101 96 - 112 mEq/L  ? CO2 29 19 - 32 mEq/L  ? Glucose, Bld 91  70 - 99 mg/dL  ? BUN 19 6 - 23 mg/dL  ? Creatinine, Ser 0.80 0.40 - 1.20 mg/dL  ? GFR 68.47 >60.00 mL/min  ? Calcium 10.6 (H) 8.4 - 10.5 mg/dL  ?Lipid panel  ?Result Value Ref Range  ? Cholesterol 128 0 - 200 mg/dL  ? Triglycerides 235.0 (H) 0.0 - 149.0 mg/dL  ? HDL 50.10 >39.00 mg/dL  ? VLDL 47.0 (H) 0.0 - 40.0 mg/dL  ? Total CHOL/HDL Ratio 3   ? NonHDL 78.11   ?  Hemoglobin A1c  ?Result Value Ref Range  ? Hgb A1c MFr Bld 5.8 4.6 - 6.5 %  ?LDL cholesterol, direct  ?Result Value Ref Range  ? Direct LDL 41.0 mg/dL  ? ? ? ?

## 2022-02-27 ENCOUNTER — Other Ambulatory Visit: Payer: Self-pay | Admitting: Family Medicine

## 2022-02-27 MED ORDER — ROSUVASTATIN CALCIUM 10 MG PO TABS: 10.0000 mg | ORAL_TABLET | Freq: Every day | ORAL | 3 refills | Status: DC

## 2022-03-03 ENCOUNTER — Encounter: Payer: Self-pay | Admitting: Family Medicine

## 2022-03-03 ENCOUNTER — Ambulatory Visit (INDEPENDENT_AMBULATORY_CARE_PROVIDER_SITE_OTHER): Payer: Medicare Other | Admitting: Family Medicine

## 2022-03-03 VITALS — BP 124/62 | HR 72 | Temp 98.3°F | Resp 18 | Ht 70.0 in | Wt 167.2 lb

## 2022-03-03 DIAGNOSIS — R7303 Prediabetes: Secondary | ICD-10-CM | POA: Diagnosis not present

## 2022-03-03 DIAGNOSIS — G8929 Other chronic pain: Secondary | ICD-10-CM

## 2022-03-03 DIAGNOSIS — M810 Age-related osteoporosis without current pathological fracture: Secondary | ICD-10-CM

## 2022-03-03 DIAGNOSIS — I10 Essential (primary) hypertension: Secondary | ICD-10-CM | POA: Diagnosis not present

## 2022-03-03 DIAGNOSIS — M545 Low back pain, unspecified: Secondary | ICD-10-CM | POA: Diagnosis not present

## 2022-03-03 DIAGNOSIS — E785 Hyperlipidemia, unspecified: Secondary | ICD-10-CM | POA: Diagnosis not present

## 2022-03-03 LAB — CBC
HCT: 42.2 % (ref 36.0–46.0)
Hemoglobin: 14.1 g/dL (ref 12.0–15.0)
MCHC: 33.5 g/dL (ref 30.0–36.0)
MCV: 90.2 fl (ref 78.0–100.0)
Platelets: 267 10*3/uL (ref 150.0–400.0)
RBC: 4.68 Mil/uL (ref 3.87–5.11)
RDW: 12.9 % (ref 11.5–15.5)
WBC: 8.3 10*3/uL (ref 4.0–10.5)

## 2022-03-03 LAB — BASIC METABOLIC PANEL
BUN: 19 mg/dL (ref 6–23)
CO2: 29 mEq/L (ref 19–32)
Calcium: 10.6 mg/dL — ABNORMAL HIGH (ref 8.4–10.5)
Chloride: 101 mEq/L (ref 96–112)
Creatinine, Ser: 0.8 mg/dL (ref 0.40–1.20)
GFR: 68.47 mL/min (ref >60.00)
Glucose, Bld: 91 mg/dL (ref 70–99)
Potassium: 3.9 mEq/L (ref 3.5–5.1)
Sodium: 138 mEq/L (ref 135–145)

## 2022-03-03 LAB — HEMOGLOBIN A1C: Hgb A1c MFr Bld: 5.8 % (ref 4.6–6.5)

## 2022-03-03 LAB — LIPID PANEL
Cholesterol: 128 mg/dL (ref 0–200)
HDL: 50.1 mg/dL (ref >39.00)
NonHDL: 78.11
Total CHOL/HDL Ratio: 3
Triglycerides: 235 mg/dL — ABNORMAL HIGH (ref 0.0–149.0)
VLDL: 47 mg/dL — ABNORMAL HIGH (ref 0.0–40.0)

## 2022-03-03 LAB — LDL CHOLESTEROL, DIRECT: Direct LDL: 41 mg/dL

## 2022-03-03 NOTE — Addendum Note (Signed)
Addended by: Lamar Blinks C on: 03/03/2022 03:45 PM ? ? Modules accepted: Orders ? ?

## 2022-03-04 ENCOUNTER — Other Ambulatory Visit: Payer: Medicare Other

## 2022-03-20 DIAGNOSIS — H52203 Unspecified astigmatism, bilateral: Secondary | ICD-10-CM | POA: Diagnosis not present

## 2022-03-28 ENCOUNTER — Encounter: Payer: Self-pay | Admitting: Family Medicine

## 2022-03-30 MED ORDER — DOXYCYCLINE HYCLATE 100 MG PO CAPS: 100.0000 mg | ORAL_CAPSULE | Freq: Two times a day (BID) | ORAL | 0 refills | Status: DC

## 2022-03-30 NOTE — Addendum Note (Signed)
Addended by: Lamar Blinks C on: 03/30/2022 11:36 AM ? ? Modules accepted: Orders ? ?

## 2022-04-04 ENCOUNTER — Other Ambulatory Visit (INDEPENDENT_AMBULATORY_CARE_PROVIDER_SITE_OTHER): Payer: Medicare Other

## 2022-04-04 ENCOUNTER — Other Ambulatory Visit: Payer: Medicare Other

## 2022-04-05 ENCOUNTER — Encounter: Payer: Self-pay | Admitting: Family Medicine

## 2022-04-05 LAB — PTH, INTACT AND CALCIUM
Calcium: 10.3 mg/dL (ref 8.6–10.4)
PTH: 70 pg/mL (ref 16–77)

## 2022-04-23 ENCOUNTER — Encounter: Payer: Self-pay | Admitting: Family Medicine

## 2022-04-24 NOTE — Progress Notes (Signed)
Therapist, music at [facility] ?[address], [address] ?[place], [place] [address] ?336 [phone] ?Fax [phone] ? ?Date:  04/28/2022  ? ?Name:  Katrina Lucas   DOB:  10/07/1941   MRN:  [id number] ? ?PCP:  Katrina Mclean, MD  ? ? ?Chief Complaint: Sinusitis (Pt c/o: intermittent sinus yellow drainage. She says this is residual from her URI that she had in March. ) ? ? ?History of Present Illness: ? ?Katrina Lucas is a 81 y.o. very pleasant female patient who presents with the following: ? ?Patient seen today with concern of sinus symptoms ?Most recent visit with myself was in March for routine follow-up ? ?History of HTN, hyperlipidemia, polyneuropathy, mild prediabetes, occasional vertigo/ BPPV for which she may use lorazepam ? ?She is a retired Marine scientist, enjoys golf ?She recently contacted me with the following MyChart message:  ?Following my respiratory and sinus infection (3/24-4/9)  and 10 days of antibiotic, I continue to have blobs of yellow sinus product 2 or 3 times daily.  I do not feel bad and no fever, but could have more energy.  Should we treat this or wait it out?  It has gone on several weeks  Antibiotic was doxycycline  100 mg bid for 10 days.  Used my emergency supply.  Thanks.  Katrina Lucas ? ?She has noted sinus drainage since 3/23- she took doxycycline for 10 days and finished it in early April ?However she continues to blow out thick and discolored mucus from her nose on a daily basis.  Is getting a bit better ? ?She notes occasional cough, no fever ?No headaches ?She does not have sinus pressure or pain in particular  ?Otherwise she is feeling ok  ? ?She has a plate in her face since a golfball injury in 1994- she is not sure if this might be related  ?No vomiting or diarrhea  ? ?She has been golfing which she enjoys  ?Patient Active Problem List  ? Diagnosis Date Noted  ? Osteoporosis 08/31/2019  ? Pre-diabetes 08/31/2019  ? Essential hypertension 08/25/2018  ?  Peripheral polyneuropathy 08/25/2018  ? Dyslipidemia 08/25/2018  ? 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 ? ?Past Medical History:  ?Diagnosis Date  ? Allergy   ? Arthritis   ? Chicken pox   ? Colon polyp   ? Diverticulitis   ? GERD (gastroesophageal reflux disease)   ? Heart murmur   ? Hyperlipidemia   ? Hypertension   ? ? ?No past surgical history on file. ? ?Social History  ? ?Tobacco Use  ? Smoking status: Never  ? Smokeless tobacco: Never  ?Substance Use Topics  ? Alcohol use: Yes  ?  Comment: 5 times a week  ? Drug use: Never  ? ? ?No family history on file. ? ?Allergies  ?Allergen Reactions  ? Sulfa Antibiotics Shortness Of Breath  ? Aspirin   ?  Microscopic bleeding ?Microscopic bleeding ?  ? Codeine Nausea And Vomiting  ? ? ?Medication list has been reviewed and updated. ? ?Current Outpatient Medications on File Prior to Visit  ?Medication Sig Dispense Refill  ? acetaminophen (TYLENOL) 325 MG tablet Take 650 mg by mouth.     ? alendronate (FOSAMAX) 70 MG tablet TAKE 1 TABLET EVERY 7 DAYS WITH A FULL GLASS OF WATER ON AN EMPTY STOMACH 12 tablet 3  ? amLODipine (NORVASC) 2.5 MG tablet TAKE 1 TABLET DAILY AS     NEEDED IF SYSTOLIC BLOOD  PRESSURE OVER 140 90 tablet 2  ? cetirizine (ZYRTEC) 10 MG tablet Take 10 mg by mouth daily.    ? LORazepam (ATIVAN) 0.5 MG tablet Take 1 tablet (0.5 mg total) by mouth as needed (dizziness). 30 tablet 0  ? losartan-hydrochlorothiazide (HYZAAR) 100-12.5 MG tablet TAKE 1 TABLET DAILY 90 tablet 3  ? Multiple Vitamins-Minerals (CENTRUM SILVER 50+WOMEN PO) Take by mouth.    ? ondansetron (ZOFRAN) 8 MG tablet Take 1 tablet (8 mg total) by mouth every 8 (eight) hours as needed for nausea or vomiting. 15 tablet 0  ? rosuvastatin (CRESTOR) 10 MG tablet Take 1 tablet (10 mg total) by mouth daily. 90 tablet 3  ? UNABLE TO FIND Med Name: Align GI one capsule daily    ? vitamin B-12 (CYANOCOBALAMIN) 1000 MCG tablet Take by mouth.    ? ?No current facility-administered medications on file  prior to visit.  ? ? ?Review of Systems: ? ?As per HPI- otherwise negative. ? ? ?Physical Examination: ?Vitals:  ? 04/28/22 1019  ?BP: 114/80  ?Pulse: 78  ?Temp: 97.7 ?F (36.5 ?C)  ?SpO2: 96%  ? ?Vitals:  ? 04/28/22 1019  ?Weight: 169 lb 9.6 oz (76.9 kg)  ?Height: '5\' 10"'$  (1.778 m)  ? ?Body mass index is 24.34 kg/m?. ?Ideal Body Weight: Weight in (lb) to have BMI = 25: 173.9 ? ?GEN: no acute distress. Normal weight looks well  ?HEENT: Atraumatic, Normocephalic.  Bilateral TM wnl, oropharynx normal.  PEERL,EOMI.  Nasal cavity is slightly inflamed esp left  ?Ears and Nose: No external deformity. ?CV: RRR, No M/G/R. No JVD. No thrill. No extra heart sounds. ?PULM: CTA B, no wheezes, crackles, rhonchi. No retractions. No resp. distress. No accessory muscle use. ?EXTR: No c/c/e ?PSYCH: Normally interactive. Conversant.  ? ? ?Assessment and Plan: ?Chronic frontal sinusitis - Plan: amoxicillin (AMOXIL) 500 MG capsule ?Treat for chronic sinusitis sx with amoxicillin ?If not improved in the next 14 days plan to get a CT scan  ?She will let me know sooner if getting worse  ? ?Signed ?Katrina Blinks, MD ? ?

## 2022-04-24 NOTE — Patient Instructions (Addendum)
It was good to see you again today, please let me know if you are not feeling better soon ?We will try a course of amoxicillin ? ?If not effective I will order a CT of your sinuses-  ?

## 2022-04-28 ENCOUNTER — Ambulatory Visit (INDEPENDENT_AMBULATORY_CARE_PROVIDER_SITE_OTHER): Payer: Medicare Other | Admitting: Family Medicine

## 2022-04-28 VITALS — BP 114/80 | HR 78 | Temp 97.7°F | Ht 70.0 in | Wt 169.6 lb

## 2022-04-28 DIAGNOSIS — J321 Chronic frontal sinusitis: Secondary | ICD-10-CM

## 2022-04-28 MED ORDER — AMOXICILLIN 500 MG PO CAPS: 1000.0000 mg | ORAL_CAPSULE | Freq: Two times a day (BID) | ORAL | 0 refills | Status: DC

## 2022-05-21 ENCOUNTER — Other Ambulatory Visit: Payer: Self-pay | Admitting: Family Medicine

## 2022-05-21 ENCOUNTER — Encounter: Payer: Self-pay | Admitting: Family Medicine

## 2022-05-21 DIAGNOSIS — M810 Age-related osteoporosis without current pathological fracture: Secondary | ICD-10-CM

## 2022-06-25 DIAGNOSIS — Z85828 Personal history of other malignant neoplasm of skin: Secondary | ICD-10-CM | POA: Diagnosis not present

## 2022-06-25 DIAGNOSIS — L720 Epidermal cyst: Secondary | ICD-10-CM | POA: Diagnosis not present

## 2022-06-25 DIAGNOSIS — L821 Other seborrheic keratosis: Secondary | ICD-10-CM | POA: Diagnosis not present

## 2022-06-25 DIAGNOSIS — Z129 Encounter for screening for malignant neoplasm, site unspecified: Secondary | ICD-10-CM | POA: Diagnosis not present

## 2022-07-27 ENCOUNTER — Other Ambulatory Visit: Payer: Self-pay | Admitting: Family Medicine

## 2022-07-27 DIAGNOSIS — I10 Essential (primary) hypertension: Secondary | ICD-10-CM

## 2022-08-27 ENCOUNTER — Other Ambulatory Visit: Payer: Self-pay | Admitting: Family Medicine

## 2022-08-27 DIAGNOSIS — I10 Essential (primary) hypertension: Secondary | ICD-10-CM

## 2022-09-19 NOTE — Patient Instructions (Signed)
It was great to see you again today, I will be in touch with your labs as soon as possible Recommend a dose of COVID-19 this fall as well as RSV- both pharmacy   Also consider getting the new Shingles series at your pharmacy  Assuming all is well, lets follow-up in about 6 months Rx for tramadol to use as needed for more severe pain- can make you drowsy, do not use when driving

## 2022-09-19 NOTE — Progress Notes (Unsigned)
[place] at [facility] [address] [place], [place] [address] [phone]  Date:  09/24/2022   Name:  Katrina Lucas   DOB:  18-Nov-1941   MRN:  [id number]  PCP:  Darreld Mclean, MD    Chief Complaint: No chief complaint on file.   History of Present Illness:  Katrina Lucas is a 81 y.o. very pleasant female patient who presents with the following:  Patient seen today for physical exam Most recent visit with myself was in May of this year for sinusitis History of HTN, hyperlipidemia, polyneuropathy, mild prediabetes, occasional vertigo/ BPPV for which she may use lorazepam   She is a retired Marine scientist, enjoys Environmental health practitioner COVID booster recommended Seasonal flu completed Pneumonia up-to-date Mammogram 11/22 Bone density 10/21-can update Colon cancer screening  Fosamax Amlodipine Lorazepam as needed losartan/HCTZ Crestor 10 Patient Active Problem List   Diagnosis Date Noted   Osteoporosis 08/31/2019   Pre-diabetes 08/31/2019   Essential hypertension 08/25/2018   Peripheral polyneuropathy 08/25/2018   Dyslipidemia 08/25/2018   Bilateral hearing loss 08/25/2018    Past Medical History:  Diagnosis Date   Allergy    Arthritis    Chicken pox    Colon polyp    Diverticulitis    GERD (gastroesophageal reflux disease)    Heart murmur    Hyperlipidemia    Hypertension     No past surgical history on file.  Social History   Tobacco Use   Smoking status: Never   Smokeless tobacco: Never  Substance Use Topics   Alcohol use: Yes    Comment: 5 times a week   Drug use: Never    No family history on file.  Allergies  Allergen Reactions   Sulfa Antibiotics Shortness Of Breath   Aspirin     Microscopic bleeding Microscopic bleeding    Codeine Nausea And Vomiting    Medication list has been reviewed and updated.  Current Outpatient Medications on File Prior to Visit  Medication Sig  Dispense Refill   acetaminophen (TYLENOL) 325 MG tablet Take 650 mg by mouth.      alendronate (FOSAMAX) 70 MG tablet TAKE 1 TABLET EVERY 7 DAYS WITH A FULL GLASS OF WATER ON AN EMPTY STOMACH 12 tablet 3   amLODipine (NORVASC) 2.5 MG tablet TAKE 1 TABLET DAILY AS     NEEDED IF SYSTOLIC BLOOD   PRESSURE OVER 140 90 tablet 2   amoxicillin (AMOXIL) 500 MG capsule Take 2 capsules (1,000 mg total) by mouth 2 (two) times daily. 40 capsule 0   cetirizine (ZYRTEC) 10 MG tablet Take 10 mg by mouth daily.     LORazepam (ATIVAN) 0.5 MG tablet Take 1 tablet (0.5 mg total) by mouth as needed (dizziness). 30 tablet 0   losartan-hydrochlorothiazide (HYZAAR) 100-12.5 MG tablet TAKE 1 TABLET DAILY 90 tablet 3   Multiple Vitamins-Minerals (CENTRUM SILVER 50+WOMEN PO) Take by mouth.     ondansetron (ZOFRAN) 8 MG tablet Take 1 tablet (8 mg total) by mouth every 8 (eight) hours as needed for nausea or vomiting. 15 tablet 0   rosuvastatin (CRESTOR) 10 MG tablet Take 1 tablet (10 mg total) by mouth daily. 90 tablet 3   UNABLE TO FIND Med Name: Align GI one capsule daily     vitamin B-12 (CYANOCOBALAMIN) 1000 MCG tablet Take by mouth.     No current facility-administered medications on file prior to visit.    Review of Systems:  As per HPI- otherwise negative.   Physical Examination: There were no vitals filed for this visit. There were no vitals filed for this visit. There is no height or weight on file to calculate BMI. Ideal Body Weight:    GEN: no acute distress. HEENT: Atraumatic, Normocephalic.  Ears and Nose: No external deformity. CV: RRR, No M/G/R. No JVD. No thrill. No extra heart sounds. PULM: CTA B, no wheezes, crackles, rhonchi. No retractions. No resp. distress. No accessory muscle use. ABD: S, NT, ND, +BS. No rebound. No HSM. EXTR: No c/c/e PSYCH: Normally interactive. Conversant. '  Assessment and Plan: *** Physical exam today.  Encouraged healthy diet and exercise routine Will plan  further follow- up pending labs.  Signed Lamar Blinks, MD

## 2022-09-24 ENCOUNTER — Ambulatory Visit (INDEPENDENT_AMBULATORY_CARE_PROVIDER_SITE_OTHER): Payer: Medicare Other | Admitting: Family Medicine

## 2022-09-24 ENCOUNTER — Encounter: Payer: Self-pay | Admitting: Family Medicine

## 2022-09-24 VITALS — BP 118/78 | HR 63 | Temp 97.6°F | Resp 18 | Ht 70.0 in | Wt 163.6 lb

## 2022-09-24 DIAGNOSIS — M810 Age-related osteoporosis without current pathological fracture: Secondary | ICD-10-CM | POA: Diagnosis not present

## 2022-09-24 DIAGNOSIS — R5383 Other fatigue: Secondary | ICD-10-CM | POA: Diagnosis not present

## 2022-09-24 DIAGNOSIS — I10 Essential (primary) hypertension: Secondary | ICD-10-CM | POA: Diagnosis not present

## 2022-09-24 DIAGNOSIS — M545 Low back pain, unspecified: Secondary | ICD-10-CM

## 2022-09-24 DIAGNOSIS — R7303 Prediabetes: Secondary | ICD-10-CM

## 2022-09-24 DIAGNOSIS — Z0000 Encounter for general adult medical examination without abnormal findings: Secondary | ICD-10-CM | POA: Diagnosis not present

## 2022-09-24 DIAGNOSIS — G8929 Other chronic pain: Secondary | ICD-10-CM

## 2022-09-24 DIAGNOSIS — E785 Hyperlipidemia, unspecified: Secondary | ICD-10-CM | POA: Diagnosis not present

## 2022-09-24 LAB — LIPID PANEL
Cholesterol: 120 mg/dL (ref 0–200)
HDL: 49.9 mg/dL (ref >39.00)
LDL Cholesterol: 41 mg/dL (ref 0–99)
NonHDL: 69.95
Total CHOL/HDL Ratio: 2
Triglycerides: 143 mg/dL (ref 0.0–149.0)
VLDL: 28.6 mg/dL (ref 0.0–40.0)

## 2022-09-24 LAB — COMPREHENSIVE METABOLIC PANEL
ALT: 13 U/L (ref 0–35)
AST: 18 U/L (ref 0–37)
Albumin: 4.5 g/dL (ref 3.5–5.2)
Alkaline Phosphatase: 44 U/L (ref 39–117)
BUN: 21 mg/dL (ref 6–23)
CO2: 28 mEq/L (ref 19–32)
Calcium: 10.1 mg/dL (ref 8.4–10.5)
Chloride: 102 mEq/L (ref 96–112)
Creatinine, Ser: 0.83 mg/dL (ref 0.40–1.20)
GFR: 65.25 mL/min (ref >60.00)
Glucose, Bld: 89 mg/dL (ref 70–99)
Potassium: 4 mEq/L (ref 3.5–5.1)
Sodium: 137 mEq/L (ref 135–145)
Total Bilirubin: 0.7 mg/dL (ref 0.2–1.2)
Total Protein: 7.2 g/dL (ref 6.0–8.3)

## 2022-09-24 LAB — CBC
HCT: 40.5 % (ref 36.0–46.0)
Hemoglobin: 13.5 g/dL (ref 12.0–15.0)
MCHC: 33.3 g/dL (ref 30.0–36.0)
MCV: 90.6 fl (ref 78.0–100.0)
Platelets: 265 10*3/uL (ref 150.0–400.0)
RBC: 4.47 Mil/uL (ref 3.87–5.11)
RDW: 13.2 % (ref 11.5–15.5)
WBC: 7.3 10*3/uL (ref 4.0–10.5)

## 2022-09-24 LAB — HEMOGLOBIN A1C: Hgb A1c MFr Bld: 5.8 % (ref 4.6–6.5)

## 2022-09-24 LAB — TSH: TSH: 0.83 u[IU]/mL (ref 0.35–5.50)

## 2022-09-24 MED ORDER — TRAMADOL HCL 50 MG PO TABS: 25.0000 mg | ORAL_TABLET | Freq: Two times a day (BID) | ORAL | 0 refills | Status: DC | PRN

## 2022-09-26 ENCOUNTER — Encounter: Payer: Self-pay | Admitting: Family Medicine

## 2022-10-16 ENCOUNTER — Ambulatory Visit (HOSPITAL_BASED_OUTPATIENT_CLINIC_OR_DEPARTMENT_OTHER)
Admission: RE | Admit: 2022-10-16 | Discharge: 2022-10-16 | Disposition: A | Discharge status: Home / Self Care | Payer: Medicare Other | Source: Ambulatory Visit | Attending: Family Medicine | Admitting: Family Medicine

## 2022-10-16 ENCOUNTER — Encounter: Payer: Self-pay | Admitting: Family Medicine

## 2022-10-16 DIAGNOSIS — M810 Age-related osteoporosis without current pathological fracture: Secondary | ICD-10-CM | POA: Diagnosis not present

## 2022-12-13 ENCOUNTER — Other Ambulatory Visit: Payer: Self-pay | Admitting: Family Medicine

## 2023-02-11 ENCOUNTER — Telehealth: Payer: Self-pay | Admitting: Family Medicine

## 2023-02-11 NOTE — Telephone Encounter (Signed)
Copied from [place] [phone]. Topic: Medicare AWV >> Feb 11, 2023 11:15 AM Devoria Glassing wrote: Reason for CRM: Called patient to schedule Medicare Annual Wellness Visit (AWV). Left message for patient to call back and schedule Medicare Annual Wellness Visit (AWV).  Last date of AWV: 02/18/2022  Please schedule an appointment at any time with [facility].  If any questions, please contact me.  Thank you ,  [place] Direct Dial: [phone]

## 2023-03-02 DIAGNOSIS — Z961 Presence of intraocular lens: Secondary | ICD-10-CM | POA: Diagnosis not present

## 2023-03-02 DIAGNOSIS — H26491 Other secondary cataract, right eye: Secondary | ICD-10-CM | POA: Diagnosis not present

## 2023-03-17 ENCOUNTER — Telehealth: Payer: Self-pay | Admitting: Family Medicine

## 2023-03-17 NOTE — Telephone Encounter (Signed)
Copied from [place] [phone]. Topic: Medicare AWV >> Mar 17, 2023  1:18 PM Devoria Glassing wrote: Reason for CRM: Called patient to schedule Medicare Annual Wellness Visit (AWV). Unable to reach patient.  Patient answered phone but could not hear me. I called twice same thing happened again. Will try again later.  Last date of AWV: 02/18/2022   Please schedule an appointment at any time with [facility]  .  If any questions, please contact me.  Thank you ,  Sherol Dade; [place] Direct Dial: [phone]

## 2023-03-20 NOTE — Progress Notes (Unsigned)
[place] at [facility] [address] [place], [place] [address] [phone]  Date:  03/25/2023   Name:  Katrina Lucas   DOB:  1941/12/20   MRN:  [id number]  PCP:  Darreld Mclean, MD    Chief Complaint: No chief complaint on file.   History of Present Illness:  Katrina Lucas is a 82 y.o. very pleasant female patient who presents with the following:  Today for periodic follow-up Most recent visit with myself was in October  History of HTN, hyperlipidemia, osteoporosis, polyneuropathy, mild prediabetes, occasional vertigo/ BPPV for which she may use lorazepam   She is a retired Marine scientist, enjoys golf  At her last visit she was having more musculoskeletal pains which were affecting her activities, pain not controlled by Tylenol.  I prescribed her tramadol to use as needed  Recommend Shingrix at her convenience  Fosamax Amlodipine Lorazepam as needed Losartan HCTZ Crestor 10 Tramadol as needed, filled #30 in October DEXA scan completed last October, osteoporosis  Mammogram can be updated  Patient Active Problem List   Diagnosis Date Noted   Osteoporosis 08/31/2019   Pre-diabetes 08/31/2019   Essential hypertension 08/25/2018   Peripheral polyneuropathy 08/25/2018   Dyslipidemia 08/25/2018   Bilateral hearing loss 08/25/2018    Past Medical History:  Diagnosis Date   Allergy    Arthritis    Chicken pox    Colon polyp    Diverticulitis    GERD (gastroesophageal reflux disease)    Heart murmur    Hyperlipidemia    Hypertension     No past surgical history on file.  Social History   Tobacco Use   Smoking status: Never   Smokeless tobacco: Never  Substance Use Topics   Alcohol use: Yes    Comment: 5 times a week   Drug use: Never    No family history on file.  Allergies  Allergen Reactions   Sulfa Antibiotics Shortness Of Breath   Aspirin     Microscopic bleeding Microscopic  bleeding    Codeine Nausea And Vomiting    Medication list has been reviewed and updated.  Current Outpatient Medications on File Prior to Visit  Medication Sig Dispense Refill   acetaminophen (TYLENOL) 325 MG tablet Take 650 mg by mouth.      alendronate (FOSAMAX) 70 MG tablet TAKE 1 TABLET EVERY 7 DAYS WITH A FULL GLASS OF WATER ON AN EMPTY STOMACH 12 tablet 3   amLODipine (NORVASC) 2.5 MG tablet TAKE 1 TABLET DAILY AS     NEEDED IF SYSTOLIC BLOOD   PRESSURE OVER 140 90 tablet 2   amoxicillin (AMOXIL) 500 MG capsule Take 2 capsules (1,000 mg total) by mouth 2 (two) times daily. 40 capsule 0   cetirizine (ZYRTEC) 10 MG tablet Take 10 mg by mouth daily.     LORazepam (ATIVAN) 0.5 MG tablet Take 1 tablet (0.5 mg total) by mouth as needed (dizziness). 30 tablet 0   losartan-hydrochlorothiazide (HYZAAR) 100-12.5 MG tablet TAKE 1 TABLET DAILY 90 tablet 3   Multiple Vitamins-Minerals (CENTRUM SILVER 50+WOMEN PO) Take by mouth.     ondansetron (ZOFRAN) 8 MG tablet Take 1 tablet (8 mg total) by mouth every 8 (eight) hours as needed for nausea or vomiting. 15 tablet 0   rosuvastatin (CRESTOR) 10 MG tablet TAKE 1 TABLET DAILY 90 tablet 3   traMADol (ULTRAM) 50 MG tablet Take 0.5-1 tablets (25-50 mg total) by mouth every 12 (  twelve) hours as needed. 30 tablet 0   UNABLE TO FIND Med Name: Align GI one capsule daily     vitamin B-12 (CYANOCOBALAMIN) 1000 MCG tablet Take by mouth.     No current facility-administered medications on file prior to visit.    Review of Systems:  As per HPI- otherwise negative.   Physical Examination: There were no vitals filed for this visit. There were no vitals filed for this visit. There is no height or weight on file to calculate BMI. Ideal Body Weight:    GEN: no acute distress. HEENT: Atraumatic, Normocephalic.  Ears and Nose: No external deformity. CV: RRR, No M/G/R. No JVD. No thrill. No extra heart sounds. PULM: CTA B, no wheezes, crackles, rhonchi.  No retractions. No resp. distress. No accessory muscle use. ABD: S, NT, ND, +BS. No rebound. No HSM. EXTR: No c/c/e PSYCH: Normally interactive. Conversant.    Assessment and Plan: ***  Signed Lamar Blinks, MD

## 2023-03-20 NOTE — Patient Instructions (Incomplete)
It was great to see you again today, I will be in touch with your labs.  Recommend getting the shingles vaccine series at your pharmacy if not done already.  Assuming all is well please see me in about 6 months  Recommend adding a nasal steroid nasal spray such as flonase or nasacort Mammo due this fall for your 2 year screening plan

## 2023-03-25 ENCOUNTER — Ambulatory Visit (INDEPENDENT_AMBULATORY_CARE_PROVIDER_SITE_OTHER): Payer: Medicare Other | Admitting: Family Medicine

## 2023-03-25 ENCOUNTER — Encounter: Payer: Self-pay | Admitting: Family Medicine

## 2023-03-25 VITALS — BP 112/72 | HR 77 | Temp 97.7°F | Resp 18 | Ht 70.0 in | Wt 167.8 lb

## 2023-03-25 DIAGNOSIS — E785 Hyperlipidemia, unspecified: Secondary | ICD-10-CM

## 2023-03-25 DIAGNOSIS — I10 Essential (primary) hypertension: Secondary | ICD-10-CM

## 2023-03-25 DIAGNOSIS — M810 Age-related osteoporosis without current pathological fracture: Secondary | ICD-10-CM

## 2023-03-25 DIAGNOSIS — G8929 Other chronic pain: Secondary | ICD-10-CM

## 2023-03-25 DIAGNOSIS — R067 Sneezing: Secondary | ICD-10-CM

## 2023-03-25 DIAGNOSIS — M545 Low back pain, unspecified: Secondary | ICD-10-CM

## 2023-03-25 DIAGNOSIS — R7303 Prediabetes: Secondary | ICD-10-CM

## 2023-03-25 LAB — BASIC METABOLIC PANEL
BUN: 22 mg/dL (ref 6–23)
CO2: 30 mEq/L (ref 19–32)
Calcium: 10.3 mg/dL (ref 8.4–10.5)
Chloride: 99 mEq/L (ref 96–112)
Creatinine, Ser: 0.85 mg/dL (ref 0.40–1.20)
GFR: 63.19 mL/min (ref >60.00)
Glucose, Bld: 94 mg/dL (ref 70–99)
Potassium: 3.9 mEq/L (ref 3.5–5.1)
Sodium: 136 mEq/L (ref 135–145)

## 2023-03-25 LAB — CBC
HCT: 41.7 % (ref 36.0–46.0)
Hemoglobin: 14.1 g/dL (ref 12.0–15.0)
MCHC: 33.8 g/dL (ref 30.0–36.0)
MCV: 89.3 fl (ref 78.0–100.0)
Platelets: 252 10*3/uL (ref 150.0–400.0)
RBC: 4.67 Mil/uL (ref 3.87–5.11)
RDW: 13 % (ref 11.5–15.5)
WBC: 8.9 10*3/uL (ref 4.0–10.5)

## 2023-03-25 LAB — HEMOGLOBIN A1C: Hgb A1c MFr Bld: 5.7 % (ref 4.6–6.5)

## 2023-03-25 LAB — POC COVID19 BINAXNOW: SARS Coronavirus 2 Ag: NEGATIVE

## 2023-04-16 ENCOUNTER — Other Ambulatory Visit: Payer: Self-pay | Admitting: Family Medicine

## 2023-04-16 DIAGNOSIS — M810 Age-related osteoporosis without current pathological fracture: Secondary | ICD-10-CM

## 2023-04-17 DIAGNOSIS — K080 Exfoliation of teeth due to systemic causes: Secondary | ICD-10-CM | POA: Diagnosis not present

## 2023-05-13 DIAGNOSIS — K080 Exfoliation of teeth due to systemic causes: Secondary | ICD-10-CM | POA: Diagnosis not present

## 2023-08-15 ENCOUNTER — Other Ambulatory Visit: Payer: Self-pay | Admitting: Family Medicine

## 2023-08-15 DIAGNOSIS — I10 Essential (primary) hypertension: Secondary | ICD-10-CM

## 2023-08-20 DIAGNOSIS — K080 Exfoliation of teeth due to systemic causes: Secondary | ICD-10-CM | POA: Diagnosis not present

## 2023-09-02 DIAGNOSIS — Z85828 Personal history of other malignant neoplasm of skin: Secondary | ICD-10-CM | POA: Diagnosis not present

## 2023-09-02 DIAGNOSIS — Z129 Encounter for screening for malignant neoplasm, site unspecified: Secondary | ICD-10-CM | POA: Diagnosis not present

## 2023-09-02 DIAGNOSIS — L821 Other seborrheic keratosis: Secondary | ICD-10-CM | POA: Diagnosis not present

## 2023-09-20 NOTE — Progress Notes (Signed)
[facility] [address], [address] [place], [place] [address] [phone]  Date:  09/28/2023   Name:  Katrina Lucas   DOB:  1941/12/12   MRN:  [id number]  PCP:  Pearline Cables, MD    Chief Complaint: Annual Exam (Concerns/ questions: refills on all meds. Lorazepam and Tramadol go local. /AWV due/#2 Shingrix due: Medicare pt)   History of Present Illness:  Katrina Lucas is a 82 y.o. very pleasant female patient who presents with the following:  Patient seen today for physical exam Most recent visit with myself was in April of this year History of HTN, hyperlipidemia, osteoporosis, polyneuropathy, mild prediabetes, occasional vertigo/ BPPV for which she may use lorazepam   She is a retired Engineer, civil (consulting), enjoys golf and exercising at the gym-she continues to play golf about once a week  Recommend COVID booster- she will do at her pharmacy  Can receive second dose of Shingrix if she likes- pt notes she got her 2nd shot already  Flu vaccine- done already Can update labs today  Bone density 1 year ago-osteoporosis, she is using Fosamax.  Can offer to update now to assess improvement-she has been on Fosamax for several years, may need to change to different therapy.  She would like to update her DEXA scan now, will check about payment coverage with her insurance company Mammogram due for update-ordered for today  She notes more issues with left knee pain She is using tramadol as needed for her knee and back pain- she does not use it very often however, I last filled #30 about a year ago/advised okay to liberalize her use slightly if necessary to keep her comfortable Patient Active Problem List   Diagnosis Date Noted   Osteoporosis 08/31/2019   Pre-diabetes 08/31/2019   Essential hypertension 08/25/2018   Peripheral polyneuropathy 08/25/2018   Dyslipidemia 08/25/2018   Bilateral hearing loss 08/25/2018    Past Medical  History:  Diagnosis Date   Allergy    Arthritis    Chicken pox    Colon polyp    Diverticulitis    GERD (gastroesophageal reflux disease)    Heart murmur    Hyperlipidemia    Hypertension     No past surgical history on file.  Social History   Tobacco Use   Smoking status: Never   Smokeless tobacco: Never  Substance Use Topics   Alcohol use: Yes    Comment: 5 times a week   Drug use: Never    No family history on file.  Allergies  Allergen Reactions   Sulfa Antibiotics Shortness Of Breath   Aspirin     Microscopic bleeding Microscopic bleeding    Codeine Nausea And Vomiting    Medication list has been reviewed and updated.  Current Outpatient Medications on File Prior to Visit  Medication Sig Dispense Refill   acetaminophen (TYLENOL) 325 MG tablet Take 650 mg by mouth.      alendronate (FOSAMAX) 70 MG tablet TAKE 1 TABLET EVERY 7 DAYS WITH A FULL GLASS OF WATER ON AN EMPTY STOMACH 12 tablet 3   cetirizine (ZYRTEC) 10 MG tablet Take 10 mg by mouth daily.     losartan-hydrochlorothiazide (HYZAAR) 100-12.5 MG tablet TAKE 1 TABLET DAILY 90 tablet 3   Multiple Vitamins-Minerals (CENTRUM SILVER 50+WOMEN PO) Take by mouth.     ondansetron (ZOFRAN) 8 MG tablet Take 1 tablet (8 mg total) by mouth every 8 (eight) hours as  needed for nausea or vomiting. 15 tablet 0   UNABLE TO FIND Med Name: Align GI one capsule daily     vitamin B-12 (CYANOCOBALAMIN) 1000 MCG tablet Take by mouth.     No current facility-administered medications on file prior to visit.    Review of Systems:  As per HPI- otherwise negative.   Physical Examination: Vitals:   09/28/23 0812  BP: 118/72  Pulse: 70  Resp: 18  Temp: 98 F (36.7 C)  SpO2: 96%   Vitals:   09/28/23 0812  Weight: 161 lb (73 kg)  Height: 5\' 10"  (1.778 m)   Body mass index is 23.1 kg/m. Ideal Body Weight: Weight in (lb) to have BMI = 25: 173.9  GEN: no acute distress.  Older lady, normal weight.  Looks  well HEENT: Atraumatic, Normocephalic.  Hard of hearing, she does use bilateral hearing aids. Bilateral TM wnl, oropharynx normal.  PEERL,EOMI.   Ears and Nose: No external deformity. CV: RRR, No M/G/R. No JVD. No thrill. No extra heart sounds. PULM: CTA B, no wheezes, crackles, rhonchi. No retractions. No resp. distress. No accessory muscle use. ABD: S, NT, ND, +BS. No rebound. No HSM. EXTR: No c/c/e PSYCH: Normally interactive. Conversant.    Assessment and Plan: Physical exam  Dyslipidemia - Plan: Lipid panel, rosuvastatin (CRESTOR) 10 MG tablet  Essential hypertension - Plan: CBC, Comprehensive metabolic panel, amLODipine (NORVASC) 2.5 MG tablet  Pre-diabetes - Plan: Comprehensive metabolic panel, Hemoglobin A1c  Age related osteoporosis, unspecified pathological fracture presence - Plan: DG Bone Density  Fatigue, unspecified type - Plan: TSH  Vertigo - Plan: LORazepam (ATIVAN) 0.5 MG tablet  Chronic bilateral low back pain without sciatica - Plan: traMADol (ULTRAM) 50 MG tablet  Encounter for screening mammogram for malignant neoplasm of breast - Plan: MM 3D SCREENING MAMMOGRAM BILATERAL BREAST  Chronic frontal sinusitis - Plan: amoxicillin (AMOXIL) 500 MG capsule  Physical exam today.  Encouraged healthy diet and exercise routine Will plan further follow- up pending labs. Ordered mammogram and bone density Encouraged appropriate immunizations Dennie Bible likes to keep a course of amoxicillin on hand for emergencies, refilled for her today  Signed Abbe Amsterdam, MD  Received labs as below, message to patient  Results for orders placed or performed in visit on 09/28/23  CBC  Result Value Ref Range   WBC 8.4 4.0 - 10.5 K/uL   RBC 4.72 3.87 - 5.11 Mil/uL   Platelets 271.0 150.0 - 400.0 K/uL   Hemoglobin 13.9 12.0 - 15.0 g/dL   HCT 78.2 95.6 - 21.3 %   MCV 90.9 78.0 - 100.0 fl   MCHC 32.4 30.0 - 36.0 g/dL   RDW 08.6 57.8 - 46.9 %  Comprehensive metabolic panel   Result Value Ref Range   Sodium 138 135 - 145 mEq/L   Potassium 4.1 3.5 - 5.1 mEq/L   Chloride 102 96 - 112 mEq/L   CO2 29 19 - 32 mEq/L   Glucose, Bld 96 70 - 99 mg/dL   BUN 20 6 - 23 mg/dL   Creatinine, Ser 6.29 0.40 - 1.20 mg/dL   Total Bilirubin 0.6 0.2 - 1.2 mg/dL   Alkaline Phosphatase 41 39 - 117 U/L   AST 18 0 - 37 U/L   ALT 14 0 - 35 U/L   Total Protein 7.0 6.0 - 8.3 g/dL   Albumin 4.4 3.5 - 5.2 g/dL   GFR 52.84 >13.24 mL/min   Calcium 10.2 8.4 - 10.5 mg/dL  Hemoglobin M0N  Result  Value Ref Range   Hgb A1c MFr Bld 5.6 4.6 - 6.5 %  Lipid panel  Result Value Ref Range   Cholesterol 118 0 - 200 mg/dL   Triglycerides 478.2 0.0 - 149.0 mg/dL   HDL 95.62 >13.08 mg/dL   VLDL 65.7 0.0 - 84.6 mg/dL   LDL Cholesterol 34 0 - 99 mg/dL   Total CHOL/HDL Ratio 2    NonHDL 63.56   TSH  Result Value Ref Range   TSH 0.79 0.35 - 5.50 uIU/mL

## 2023-09-20 NOTE — Patient Instructions (Addendum)
It was good to see you again today, assuming all is well please see me in about 6 months I ordered your mammogram and a bone density- certainly ok to make sure your insurance will cover prior to getting this done!  It may be handy to get an updated dexa so we can see if you need to continue on Fosamax or if we should try something else for you  Recommend covid booster this fall  Enjoy the fall season!

## 2023-09-28 ENCOUNTER — Telehealth: Payer: Self-pay | Admitting: Family Medicine

## 2023-09-28 ENCOUNTER — Encounter: Payer: Self-pay | Admitting: Family Medicine

## 2023-09-28 ENCOUNTER — Ambulatory Visit (INDEPENDENT_AMBULATORY_CARE_PROVIDER_SITE_OTHER): Payer: Medicare Other | Admitting: Family Medicine

## 2023-09-28 VITALS — BP 118/72 | HR 70 | Temp 98.0°F | Resp 18 | Ht 70.0 in | Wt 161.0 lb

## 2023-09-28 DIAGNOSIS — Z0000 Encounter for general adult medical examination without abnormal findings: Secondary | ICD-10-CM

## 2023-09-28 DIAGNOSIS — R7303 Prediabetes: Secondary | ICD-10-CM | POA: Diagnosis not present

## 2023-09-28 DIAGNOSIS — I10 Essential (primary) hypertension: Secondary | ICD-10-CM | POA: Diagnosis not present

## 2023-09-28 DIAGNOSIS — M545 Low back pain, unspecified: Secondary | ICD-10-CM

## 2023-09-28 DIAGNOSIS — R5383 Other fatigue: Secondary | ICD-10-CM

## 2023-09-28 DIAGNOSIS — Z1231 Encounter for screening mammogram for malignant neoplasm of breast: Secondary | ICD-10-CM

## 2023-09-28 DIAGNOSIS — E785 Hyperlipidemia, unspecified: Secondary | ICD-10-CM

## 2023-09-28 DIAGNOSIS — G8929 Other chronic pain: Secondary | ICD-10-CM

## 2023-09-28 DIAGNOSIS — R42 Dizziness and giddiness: Secondary | ICD-10-CM

## 2023-09-28 DIAGNOSIS — J321 Chronic frontal sinusitis: Secondary | ICD-10-CM

## 2023-09-28 DIAGNOSIS — M810 Age-related osteoporosis without current pathological fracture: Secondary | ICD-10-CM

## 2023-09-28 LAB — COMPREHENSIVE METABOLIC PANEL
ALT: 14 U/L (ref 0–35)
AST: 18 U/L (ref 0–37)
Albumin: 4.4 g/dL (ref 3.5–5.2)
Alkaline Phosphatase: 41 U/L (ref 39–117)
BUN: 20 mg/dL (ref 6–23)
CO2: 29 meq/L (ref 19–32)
Calcium: 10.2 mg/dL (ref 8.4–10.5)
Chloride: 102 meq/L (ref 96–112)
Creatinine, Ser: 0.79 mg/dL (ref 0.40–1.20)
GFR: 68.75 mL/min (ref >60.00)
Glucose, Bld: 96 mg/dL (ref 70–99)
Potassium: 4.1 meq/L (ref 3.5–5.1)
Sodium: 138 meq/L (ref 135–145)
Total Bilirubin: 0.6 mg/dL (ref 0.2–1.2)
Total Protein: 7 g/dL (ref 6.0–8.3)

## 2023-09-28 LAB — CBC
HCT: 42.9 % (ref 36.0–46.0)
Hemoglobin: 13.9 g/dL (ref 12.0–15.0)
MCHC: 32.4 g/dL (ref 30.0–36.0)
MCV: 90.9 fL (ref 78.0–100.0)
Platelets: 271 10*3/uL (ref 150.0–400.0)
RBC: 4.72 Mil/uL (ref 3.87–5.11)
RDW: 13.2 % (ref 11.5–15.5)
WBC: 8.4 10*3/uL (ref 4.0–10.5)

## 2023-09-28 LAB — HEMOGLOBIN A1C: Hgb A1c MFr Bld: 5.6 % (ref 4.6–6.5)

## 2023-09-28 LAB — LIPID PANEL
Cholesterol: 118 mg/dL (ref 0–200)
HDL: 54.1 mg/dL (ref >39.00)
LDL Cholesterol: 34 mg/dL (ref 0–99)
NonHDL: 63.56
Total CHOL/HDL Ratio: 2
Triglycerides: 146 mg/dL (ref 0.0–149.0)
VLDL: 29.2 mg/dL (ref 0.0–40.0)

## 2023-09-28 LAB — TSH: TSH: 0.79 u[IU]/mL (ref 0.35–5.50)

## 2023-09-28 MED ORDER — ROSUVASTATIN CALCIUM 10 MG PO TABS
10.0000 mg | ORAL_TABLET | Freq: Every day | ORAL | 3 refills | Status: DC
Start: 2023-09-28 — End: 2024-09-26

## 2023-09-28 MED ORDER — AMLODIPINE BESYLATE 2.5 MG PO TABS
2.5000 mg | ORAL_TABLET | Freq: Every day | ORAL | 3 refills | Status: DC
Start: 2023-09-28 — End: 2023-10-05

## 2023-09-28 MED ORDER — AMOXICILLIN 500 MG PO CAPS: 1000.0000 mg | ORAL_CAPSULE | Freq: Two times a day (BID) | ORAL | 0 refills | Status: DC

## 2023-09-28 MED ORDER — TRAMADOL HCL 50 MG PO TABS
25.0000 mg | ORAL_TABLET | Freq: Two times a day (BID) | ORAL | 1 refills | Status: DC | PRN
Start: 2023-09-28 — End: 2023-10-05

## 2023-09-28 MED ORDER — LORAZEPAM 0.5 MG PO TABS
0.5000 mg | ORAL_TABLET | ORAL | 0 refills | Status: DC | PRN
Start: 2023-09-28 — End: 2024-03-28

## 2023-09-28 NOTE — Telephone Encounter (Signed)
Pharmacy called and would like to know how long the ativan should last since directions just say as needed. Also stated pt is allergic to codeine and the tramadol might have a cross reaction. Call back is [phone] option 2.

## 2023-09-28 NOTE — Telephone Encounter (Signed)
Called [employer] and gave needed information

## 2023-10-05 MED ORDER — TRAMADOL HCL 50 MG PO TABS: 25.0000 mg | ORAL_TABLET | Freq: Two times a day (BID) | ORAL | 0 refills | Status: DC | PRN

## 2023-10-05 MED ORDER — AMLODIPINE BESYLATE 2.5 MG PO TABS
2.5000 mg | ORAL_TABLET | Freq: Every day | ORAL | 3 refills | Status: DC
Start: 2023-10-05 — End: 2024-05-02

## 2023-10-05 NOTE — Addendum Note (Signed)
Addended by: Abbe Amsterdam C on: 10/05/2023 04:03 PM   Modules accepted: Orders

## 2023-11-02 ENCOUNTER — Ambulatory Visit (HOSPITAL_BASED_OUTPATIENT_CLINIC_OR_DEPARTMENT_OTHER)
Admission: RE | Admit: 2023-11-02 | Discharge: 2023-11-02 | Disposition: A | Discharge status: Home / Self Care | Payer: Medicare Other | Source: Ambulatory Visit | Attending: Family Medicine | Admitting: Family Medicine

## 2023-11-02 ENCOUNTER — Encounter (HOSPITAL_BASED_OUTPATIENT_CLINIC_OR_DEPARTMENT_OTHER): Payer: Self-pay

## 2023-11-02 DIAGNOSIS — Z1231 Encounter for screening mammogram for malignant neoplasm of breast: Secondary | ICD-10-CM | POA: Insufficient documentation

## 2023-11-04 ENCOUNTER — Other Ambulatory Visit: Payer: Self-pay | Admitting: Family Medicine

## 2023-11-04 DIAGNOSIS — R928 Other abnormal and inconclusive findings on diagnostic imaging of breast: Secondary | ICD-10-CM

## 2023-11-09 ENCOUNTER — Other Ambulatory Visit: Payer: Self-pay | Admitting: Family Medicine

## 2023-11-09 ENCOUNTER — Ambulatory Visit
Admission: RE | Admit: 2023-11-09 | Discharge: 2023-11-09 | Disposition: A | Discharge status: Home / Self Care | Payer: Medicare Other | Source: Ambulatory Visit | Attending: Family Medicine | Admitting: Family Medicine

## 2023-11-09 DIAGNOSIS — N6320 Unspecified lump in the left breast, unspecified quadrant: Secondary | ICD-10-CM

## 2023-11-09 DIAGNOSIS — R928 Other abnormal and inconclusive findings on diagnostic imaging of breast: Secondary | ICD-10-CM | POA: Diagnosis not present

## 2023-11-09 DIAGNOSIS — N6322 Unspecified lump in the left breast, upper inner quadrant: Secondary | ICD-10-CM | POA: Diagnosis not present

## 2023-11-12 DIAGNOSIS — K080 Exfoliation of teeth due to systemic causes: Secondary | ICD-10-CM | POA: Diagnosis not present

## 2023-12-23 DIAGNOSIS — K080 Exfoliation of teeth due to systemic causes: Secondary | ICD-10-CM | POA: Diagnosis not present

## 2024-03-02 DIAGNOSIS — H52203 Unspecified astigmatism, bilateral: Secondary | ICD-10-CM | POA: Diagnosis not present

## 2024-03-10 ENCOUNTER — Other Ambulatory Visit: Payer: Self-pay | Admitting: Family Medicine

## 2024-03-10 DIAGNOSIS — M810 Age-related osteoporosis without current pathological fracture: Secondary | ICD-10-CM

## 2024-03-23 NOTE — Patient Instructions (Addendum)
 Good to see you again today!  I will be in touch with your labs and we can plan to order your bone density in the fall I will get you set up with orthopedics for your chronic knee pain- I bet injections might help you  Please check your BP over the next couple of weeks and let me know how it looks; we can easily bump the amlodipine to 5 mg if we need to  Thanks again for your kindness in switching appt times today and for the jalapeno jelly!

## 2024-03-23 NOTE — Progress Notes (Signed)
 [facility] at [facility] [address], [address] [place], [place] [address] [phone]  Date:  03/28/2024   Name:  Katrina Lucas   DOB:  [birth year]   MRN:  [id number]  PCP:  Pearline Cables, MD    Chief Complaint: No chief complaint on file.   History of Present Illness:  Katrina Lucas is a 83 y.o. very pleasant female patient who presents with the following:  Patient is seen today for periodic follow-up.  Most recent visit with myself was in October for her physical  Most recent visit with myself was in April of this year History of HTN, hyperlipidemia, osteoporosis, polyneuropathy, mild prediabetes, occasional vertigo/ BPPV for which she may use lorazepam   She is a retired Engineer, civil (consulting), enjoys golf and exercising at the gym-she continues to play golf about once a week-she hopes to get the golf season start up again soon   We plan to get an updated dexa for her this fall to see how she is responding to foasmax - we will do in October  Labs done in October-  She hurt her knee last fall - she is still able to do most things but notes it holds her back   She did have knee steroid shot many years ago- it did help  She is not interested in knee surgery at this time, but would like to see orthopedics for consideration of possible steroid injection  Dennie Bible has significant hard of hearing, she does okay with her hearing aids.  However she notes difficulty using an alarm clock because she cannot hear it when her hearing aids are out overnight   Patient Active Problem List   Diagnosis Date Noted   Osteoporosis 08/31/2019   Pre-diabetes 08/31/2019   Essential hypertension 08/25/2018   Peripheral polyneuropathy 08/25/2018   Dyslipidemia 08/25/2018   Bilateral hearing loss 08/25/2018    Past Medical History:  Diagnosis Date   Allergy    Arthritis    Chicken pox    Colon polyp    Diverticulitis    GERD (gastroesophageal reflux  disease)    Heart murmur    Hyperlipidemia    Hypertension     No past surgical history on file.  Social History   Tobacco Use   Smoking status: Never   Smokeless tobacco: Never  Substance Use Topics   Alcohol use: Yes    Comment: 5 times a week   Drug use: Never    No family history on file.  Allergies  Allergen Reactions   Sulfa Antibiotics Shortness Of Breath   Aspirin     Microscopic bleeding Microscopic bleeding    Codeine Nausea And Vomiting    Medication list has been reviewed and updated.  Current Outpatient Medications on File Prior to Visit  Medication Sig Dispense Refill   acetaminophen (TYLENOL) 325 MG tablet Take 650 mg by mouth.      alendronate (FOSAMAX) 70 MG tablet TAKE 1 TABLET EVERY 7 DAYS WITH A FULL GLASS OF WATER ON AN EMPTY STOMACH 12 tablet 3   amLODipine (NORVASC) 2.5 MG tablet Take 1 tablet (2.5 mg total) by mouth daily. 30 tablet 3   cetirizine (ZYRTEC) 10 MG tablet Take 10 mg by mouth daily.     losartan-hydrochlorothiazide (HYZAAR) 100-12.5 MG tablet TAKE 1 TABLET DAILY 90 tablet 3   Multiple Vitamins-Minerals (CENTRUM SILVER 50+WOMEN PO) Take by mouth.     ondansetron (ZOFRAN) 8 MG tablet  Take 1 tablet (8 mg total) by mouth every 8 (eight) hours as needed for nausea or vomiting. 15 tablet 0   rosuvastatin (CRESTOR) 10 MG tablet Take 1 tablet (10 mg total) by mouth daily. 90 tablet 3   UNABLE TO FIND Med Name: Align GI one capsule daily     vitamin B-12 (CYANOCOBALAMIN) 1000 MCG tablet Take by mouth.     No current facility-administered medications on file prior to visit.    Review of Systems:  As per HPI- otherwise negative.   Physical Examination: Vitals:   03/28/24 0938 03/28/24 0952  BP: (!) 146/96 (!) 150/80  Pulse: 75   Resp: 16   Temp: 98.4 F (36.9 C)   SpO2: 100%    Vitals:   03/28/24 0938  Weight: 163 lb (73.9 kg)  Height: 5\' 10"  (1.778 m)   Body mass index is 23.39 kg/m. Ideal Body Weight: Weight in (lb) to  have BMI = 25: 173.9  GEN: no acute distress.  Looks well, very spry and active for age HEENT: Atraumatic, Normocephalic.  Ears and Nose: No external deformity. CV: RRR, No M/G/R. No JVD. No thrill. No extra heart sounds. PULM: CTA B, no wheezes, crackles, rhonchi. No retractions. No resp. distress. No accessory muscle use. ABD: S, NT, ND, +BS. No rebound. No HSM. EXTR: No c/c/e PSYCH: Normally interactive. Conversant.  Left knee - crepitus but no significant effusion, no redness.  BP Readings from Last 3 Encounters:  03/28/24 (!) 150/80  09/28/23 118/72  03/25/23 112/72     Assessment and Plan: Essential hypertension - Plan: Basic metabolic panel with GFR, CBC  Dyslipidemia  Pre-diabetes  Chronic frontal sinusitis - Plan: amoxicillin (AMOXIL) 500 MG capsule  Vertigo - Plan: LORazepam (ATIVAN) 0.5 MG tablet  Chronic bilateral low back pain without sciatica - Plan: traMADol (ULTRAM) 50 MG tablet  Chronic pain of left knee - Plan: Ambulatory referral to Orthopedic Surgery  HOH (hard of hearing)  Patient seen today for follow-up.  We note her blood pressure is elevated today, she does check that at home and it is typically okay.  She will keep an eye on her blood pressure over the next couple of weeks and update me.  If needed we can increase her amlodipine  Referral to orthopedics to assess her knee, I suspect injections might be helpful for her.  Refilled tramadol that she uses on occasion  Refill lorazepam that she uses rarely for vertigo  Patient tends to have sinus infections frequently, she likes to keep amoxicillin on hand.  Refilled for her today  We discussed some options for an alarm clock that might work for someone who is hard of hearing-she will look into these on [employer] Abbe Amsterdam, MD  Received labs, message to pt  Results for orders placed or performed in visit on 03/28/24  Basic metabolic panel with GFR   Collection Time: 03/28/24  9:59  AM  Result Value Ref Range   Sodium 139 135 - 145 mEq/L   Potassium 3.8 3.5 - 5.1 mEq/L   Chloride 103 96 - 112 mEq/L   CO2 28 19 - 32 mEq/L   Glucose, Bld 92 70 - 99 mg/dL   BUN 18 6 - 23 mg/dL   Creatinine, Ser 6.57 0.40 - 1.20 mg/dL   GFR 84.69 >62.95 mL/min   Calcium 10.1 8.4 - 10.5 mg/dL  CBC   Collection Time: 03/28/24  9:59 AM  Result Value Ref Range   WBC 7.1  4.0 - 10.5 K/uL   RBC 4.54 3.87 - 5.11 Mil/uL   Platelets 286.0 150.0 - 400.0 K/uL   Hemoglobin 13.7 12.0 - 15.0 g/dL   HCT 91.4 78.2 - 95.6 %   MCV 89.7 78.0 - 100.0 fl   MCHC 33.6 30.0 - 36.0 g/dL   RDW 21.3 08.6 - 57.8 %

## 2024-03-26 ENCOUNTER — Encounter: Payer: Self-pay | Admitting: Family Medicine

## 2024-03-28 ENCOUNTER — Encounter: Payer: Self-pay | Admitting: Family Medicine

## 2024-03-28 ENCOUNTER — Ambulatory Visit (INDEPENDENT_AMBULATORY_CARE_PROVIDER_SITE_OTHER): Payer: Medicare Other | Admitting: Family Medicine

## 2024-03-28 VITALS — BP 150/80 | HR 75 | Temp 98.4°F | Resp 16 | Ht 70.0 in | Wt 163.0 lb

## 2024-03-28 DIAGNOSIS — M545 Low back pain, unspecified: Secondary | ICD-10-CM

## 2024-03-28 DIAGNOSIS — I10 Essential (primary) hypertension: Secondary | ICD-10-CM

## 2024-03-28 DIAGNOSIS — R42 Dizziness and giddiness: Secondary | ICD-10-CM

## 2024-03-28 DIAGNOSIS — E785 Hyperlipidemia, unspecified: Secondary | ICD-10-CM | POA: Diagnosis not present

## 2024-03-28 DIAGNOSIS — R7303 Prediabetes: Secondary | ICD-10-CM | POA: Diagnosis not present

## 2024-03-28 DIAGNOSIS — H9190 Unspecified hearing loss, unspecified ear: Secondary | ICD-10-CM

## 2024-03-28 DIAGNOSIS — J321 Chronic frontal sinusitis: Secondary | ICD-10-CM | POA: Diagnosis not present

## 2024-03-28 DIAGNOSIS — G8929 Other chronic pain: Secondary | ICD-10-CM

## 2024-03-28 DIAGNOSIS — M25562 Pain in left knee: Secondary | ICD-10-CM

## 2024-03-28 LAB — BASIC METABOLIC PANEL WITH GFR
BUN: 18 mg/dL (ref 6–23)
CO2: 28 meq/L (ref 19–32)
Calcium: 10.1 mg/dL (ref 8.4–10.5)
Chloride: 103 meq/L (ref 96–112)
Creatinine, Ser: 0.79 mg/dL (ref 0.40–1.20)
GFR: 68.51 mL/min (ref >60.00)
Glucose, Bld: 92 mg/dL (ref 70–99)
Potassium: 3.8 meq/L (ref 3.5–5.1)
Sodium: 139 meq/L (ref 135–145)

## 2024-03-28 LAB — CBC
HCT: 40.7 % (ref 36.0–46.0)
Hemoglobin: 13.7 g/dL (ref 12.0–15.0)
MCHC: 33.6 g/dL (ref 30.0–36.0)
MCV: 89.7 fl (ref 78.0–100.0)
Platelets: 286 10*3/uL (ref 150.0–400.0)
RBC: 4.54 Mil/uL (ref 3.87–5.11)
RDW: 13.1 % (ref 11.5–15.5)
WBC: 7.1 10*3/uL (ref 4.0–10.5)

## 2024-03-28 MED ORDER — AMOXICILLIN 500 MG PO CAPS: 1000.0000 mg | ORAL_CAPSULE | Freq: Two times a day (BID) | ORAL | 0 refills | Status: DC

## 2024-03-28 MED ORDER — LORAZEPAM 0.5 MG PO TABS: 0.5000 mg | ORAL_TABLET | ORAL | 0 refills | Status: DC | PRN

## 2024-03-28 MED ORDER — TRAMADOL HCL 50 MG PO TABS: 25.0000 mg | ORAL_TABLET | Freq: Two times a day (BID) | ORAL | 0 refills | Status: DC | PRN

## 2024-03-30 ENCOUNTER — Other Ambulatory Visit: Payer: Self-pay | Admitting: Family Medicine

## 2024-03-30 DIAGNOSIS — R42 Dizziness and giddiness: Secondary | ICD-10-CM

## 2024-03-30 MED ORDER — LORAZEPAM 0.5 MG PO TABS: 0.5000 mg | ORAL_TABLET | Freq: Two times a day (BID) | ORAL | 0 refills | Status: AC | PRN

## 2024-03-31 ENCOUNTER — Other Ambulatory Visit (INDEPENDENT_AMBULATORY_CARE_PROVIDER_SITE_OTHER): Payer: Self-pay

## 2024-03-31 ENCOUNTER — Other Ambulatory Visit: Payer: Self-pay | Admitting: Physician Assistant

## 2024-03-31 ENCOUNTER — Encounter: Payer: Self-pay | Admitting: Physician Assistant

## 2024-03-31 ENCOUNTER — Ambulatory Visit: Admitting: Physician Assistant

## 2024-03-31 DIAGNOSIS — M25561 Pain in right knee: Secondary | ICD-10-CM

## 2024-03-31 DIAGNOSIS — M170 Bilateral primary osteoarthritis of knee: Secondary | ICD-10-CM | POA: Diagnosis not present

## 2024-03-31 DIAGNOSIS — M25562 Pain in left knee: Secondary | ICD-10-CM

## 2024-03-31 DIAGNOSIS — G8929 Other chronic pain: Secondary | ICD-10-CM

## 2024-03-31 NOTE — Progress Notes (Signed)
 Office Visit Note   Patient: Katrina Lucas           Date of Birth: [birth year]           MRN: [id number] Visit Date: 03/31/2024              Requested by: Pearline Cables, MD [address] [place],  [place] [address] PCP: Pearline Cables, MD   Assessment & Plan: Visit Diagnoses:  1. Chronic pain of both knees   2. Primary osteoarthritis of both knees     Plan: Patient is a pleasant 83 year old woman with a long history of bilateral knee pain.  She has been told she has arthritis.  She does take Tylenol and use Voltaren gel.  She like to discuss injections today.  We discussed steroid injections as well as viscosupplementation and PRP.  I think she would do well trying a steroid injection first as she has not had any of these.  She cannot have them today but is going to follow-up with me in a couple weeks at her convenience  Follow-Up Instructions: As needed  Orders:  Orders Placed This Encounter  Procedures   XR KNEE 3 VIEW RIGHT   XR KNEE 3 VIEW LEFT   No orders of the defined types were placed in this encounter.     Procedures: No procedures performed   Clinical Data: No additional findings.   Subjective: Chief Complaint  Patient presents with   Right Knee - Pain   Left Knee - Pain    HPI Pleasant 83 year old woman presents with left greater than right knee pain has a history of osteoarthritis the left knee has been worse since she hit it on a bench.  She takes Tylenol and Voltaren gel Review of Systems  All other systems reviewed and are negative.    Objective: Vital Signs: There were no vitals taken for this visit.  Physical Exam Constitutional:      Appearance: Normal appearance.  Skin:    General: Skin is warm and dry.  Neurological:     General: No focal deficit present.     Mental Status: She is alert and oriented to person, place, and time.  Psychiatric:        Mood and Affect: Mood normal.        Behavior:  Behavior normal.     Ortho Exam Examination of her left knee she has no effusion no erythema compartments are soft and compressible she is neurovascularly intact she has grinding with range of motion. Specialty Comments:  No specialty comments available.  Imaging: XR KNEE 3 VIEW LEFT Result Date: 03/31/2024 Radiographs of her left knee demonstrate tricompartmental arthritis with periarticular osteophytes and sclerotic changes joint space narrowing  XR KNEE 3 VIEW RIGHT Result Date: 03/31/2024 Radiographs of her right knee demonstrate tricompartmental arthritis with periarticular osteophytes and sclerotic changes    PMFS History: Patient Active Problem List   Diagnosis Date Noted   Osteoarthritis of knees, bilateral 03/31/2024   Osteoporosis 08/31/2019   Pre-diabetes 08/31/2019   Essential hypertension 08/25/2018   Peripheral polyneuropathy 08/25/2018   Dyslipidemia 08/25/2018   Bilateral hearing loss 08/25/2018   Past Medical History:  Diagnosis Date   Allergy    Arthritis    Chicken pox    Colon polyp    Diverticulitis    GERD (gastroesophageal reflux disease)    Heart murmur    Hyperlipidemia    Hypertension     History  reviewed. No pertinent family history.  History reviewed. No pertinent surgical history. Social History   Occupational History   Not on file  Tobacco Use   Smoking status: Never   Smokeless tobacco: Never  Substance and Sexual Activity   Alcohol use: Yes    Comment: 5 times a week   Drug use: Never   Sexual activity: Not on file

## 2024-03-31 NOTE — Progress Notes (Unsigned)
   Office Visit Note   Patient: Katrina Lucas           Date of Birth: 09-Jun-1941           MRN: [id number] Visit Date: 03/31/2024              Requested by: No referring provider defined for this encounter. PCP: Copland, Gwenlyn Found, MD   Assessment & Plan: Visit Diagnoses: No diagnosis found.  Plan: ***  Follow-Up Instructions: No follow-ups on file.   Orders:  No orders of the defined types were placed in this encounter.  No orders of the defined types were placed in this encounter.     Procedures: No procedures performed   Clinical Data: No additional findings.   Subjective: No chief complaint on file.   HPI  Review of Systems  All other systems reviewed and are negative.    Objective: Vital Signs: There were no vitals taken for this visit.  Physical Exam Constitutional:      Appearance: Normal appearance.  Pulmonary:     Effort: Pulmonary effort is normal.  Skin:    General: Skin is warm and dry.  Neurological:     Mental Status: She is alert.  Psychiatric:        Mood and Affect: Mood normal.        Behavior: Behavior normal.     Ortho Exam  Specialty Comments:  No specialty comments available.  Imaging: No results found.   PMFS History: Patient Active Problem List   Diagnosis Date Noted   Osteoporosis 08/31/2019   Pre-diabetes 08/31/2019   Essential hypertension 08/25/2018   Peripheral polyneuropathy 08/25/2018   Dyslipidemia 08/25/2018   Bilateral hearing loss 08/25/2018   Past Medical History:  Diagnosis Date   Allergy    Arthritis    Chicken pox    Colon polyp    Diverticulitis    GERD (gastroesophageal reflux disease)    Heart murmur    Hyperlipidemia    Hypertension     No family history on file.  No past surgical history on file. Social History   Occupational History   Not on file  Tobacco Use   Smoking status: Never   Smokeless tobacco: Never  Substance and Sexual Activity   Alcohol use: Yes     Comment: 5 times a week   Drug use: Never   Sexual activity: Not on file

## 2024-04-03 ENCOUNTER — Encounter: Payer: Self-pay | Admitting: Family Medicine

## 2024-04-03 DIAGNOSIS — I10 Essential (primary) hypertension: Secondary | ICD-10-CM

## 2024-04-11 MED ORDER — LOSARTAN POTASSIUM-HCTZ 100-25 MG PO TABS: 1.0000 | ORAL_TABLET | Freq: Every day | ORAL | 3 refills | Status: DC

## 2024-04-11 MED ORDER — LOSARTAN POTASSIUM-HCTZ 100-12.5 MG PO TABS: 1.0000 | ORAL_TABLET | Freq: Every day | ORAL | 3 refills | Status: DC

## 2024-04-11 NOTE — Addendum Note (Signed)
 Addended by: Gates Kasal C on: 04/11/2024 03:27 PM   Modules accepted: Orders

## 2024-04-12 MED ORDER — LOSARTAN POTASSIUM-HCTZ 100-25 MG PO TABS: 1.0000 | ORAL_TABLET | Freq: Every day | ORAL | 3 refills | Status: AC

## 2024-04-12 NOTE — Addendum Note (Signed)
 Addended by: Kaylee Partridge on: 04/12/2024 08:59 PM   Modules accepted: Orders

## 2024-04-21 DIAGNOSIS — H26491 Other secondary cataract, right eye: Secondary | ICD-10-CM | POA: Diagnosis not present

## 2024-04-27 DIAGNOSIS — Z85828 Personal history of other malignant neoplasm of skin: Secondary | ICD-10-CM | POA: Diagnosis not present

## 2024-04-27 DIAGNOSIS — L821 Other seborrheic keratosis: Secondary | ICD-10-CM | POA: Diagnosis not present

## 2024-04-27 DIAGNOSIS — D1801 Hemangioma of skin and subcutaneous tissue: Secondary | ICD-10-CM | POA: Diagnosis not present

## 2024-04-27 DIAGNOSIS — Z129 Encounter for screening for malignant neoplasm, site unspecified: Secondary | ICD-10-CM | POA: Diagnosis not present

## 2024-04-29 ENCOUNTER — Encounter: Payer: Self-pay | Admitting: Physician Assistant

## 2024-04-29 ENCOUNTER — Ambulatory Visit (INDEPENDENT_AMBULATORY_CARE_PROVIDER_SITE_OTHER): Admitting: Physician Assistant

## 2024-04-29 DIAGNOSIS — M1712 Unilateral primary osteoarthritis, left knee: Secondary | ICD-10-CM

## 2024-04-29 DIAGNOSIS — M170 Bilateral primary osteoarthritis of knee: Secondary | ICD-10-CM

## 2024-04-29 MED ORDER — METHYLPREDNISOLONE ACETATE 40 MG/ML IJ SUSP
40.0000 mg | INTRAMUSCULAR | Status: AC | PRN
  Administered 2024-04-29: 40 mg via INTRA_ARTICULAR

## 2024-04-29 MED ORDER — LIDOCAINE HCL 1 % IJ SOLN
3.0000 mL | INTRAMUSCULAR | Status: AC | PRN
  Administered 2024-04-29: 3 mL

## 2024-04-29 NOTE — Progress Notes (Signed)
 Office Visit Note   Patient: Katrina Lucas           Date of Birth: 11/01/1941           MRN: [id number] Visit Date: 04/29/2024              Requested by: Kaylee Partridge, MD [address] [place],  [place] [address] PCP: Kaylee Partridge, MD  Chief Complaint  Patient presents with  . Left Knee - Follow-up      HPI: Patient is a pleasant 83 year old woman who comes in today has a history of arthritis of her bilateral knees.  Comes in for an injection into her left knee today no new injuries  Assessment & Plan: Visit Diagnoses:  1. Primary osteoarthritis of both knees     Plan: Went forward with a left knee injection today she is tolerated this very well should follow-up as needed  Follow-Up Instructions: Return if symptoms worsen or fail to improve.   Ortho Exam  Patient is alert, oriented, no adenopathy, well-dressed, normal affect, normal respiratory effort. Left knee no erythema no effusion she is neurovascularly intact.  Compartments are soft and nontender  Imaging: No results found. No images are attached to the encounter.  Labs: Lab Results  Component Value Date   HGBA1C 5.6 09/28/2023   HGBA1C 5.7 03/25/2023   HGBA1C 5.8 09/24/2022     Lab Results  Component Value Date   ALBUMIN 4.4 09/28/2023   ALBUMIN 4.5 09/24/2022   ALBUMIN 4.5 09/18/2021    No results found for: "MG" No results found for: "VD25OH"  No results found for: "PREALBUMIN"    Latest Ref Rng & Units 03/28/2024    9:59 AM 09/28/2023    8:40 AM 03/25/2023    9:13 AM  CBC EXTENDED  WBC 4.0 - 10.5 K/uL 7.1  8.4  8.9   RBC 3.87 - 5.11 Mil/uL 4.54  4.72  4.67   Hemoglobin 12.0 - 15.0 g/dL 62.1  30.8  65.7   HCT 36.0 - 46.0 % 40.7  42.9  41.7   Platelets 150.0 - 400.0 K/uL 286.0  271.0  252.0      There is no height or weight on file to calculate BMI.  Orders:  No orders of the defined types were placed in this encounter.  No orders of the defined types  were placed in this encounter.    Procedures: Large Joint Inj on 04/29/2024 2:13 PM Indications: pain and diagnostic evaluation Details: 25 G 1.5 in needle, anteromedial approach  Arthrogram: No  Medications: 40 mg methylPREDNISolone acetate 40 MG/ML; 3 mL lidocaine 1 % Outcome: tolerated well, no immediate complications Procedure, treatment alternatives, risks and benefits explained, specific risks discussed. Consent was given by the patient.    Clinical Data: No additional findings.  ROS:  All other systems negative, except as noted in the HPI. Review of Systems  Objective: Vital Signs: There were no vitals taken for this visit.  Specialty Comments:  No specialty comments available.  PMFS History: Patient Active Problem List   Diagnosis Date Noted  . Osteoarthritis of knees, bilateral 03/31/2024  . Osteoporosis 08/31/2019  . Pre-diabetes 08/31/2019  . Essential hypertension 08/25/2018  . Peripheral polyneuropathy 08/25/2018  . Dyslipidemia 08/25/2018  . Bilateral hearing loss 08/25/2018   Past Medical History:  Diagnosis Date  . Allergy   . Arthritis   . Chicken pox   . Colon polyp   . Diverticulitis   .  GERD (gastroesophageal reflux disease)   . Heart murmur   . Hyperlipidemia   . Hypertension     No family history on file.  No past surgical history on file. Social History   Occupational History  . Not on file  Tobacco Use  . Smoking status: Never  . Smokeless tobacco: Never  Substance and Sexual Activity  . Alcohol use: Yes    Comment: 5 times a week  . Drug use: Never  . Sexual activity: Not on file

## 2024-05-02 ENCOUNTER — Encounter (HOSPITAL_COMMUNITY): Payer: Self-pay

## 2024-05-02 MED ORDER — AMLODIPINE BESYLATE 5 MG PO TABS: 5.0000 mg | ORAL_TABLET | Freq: Every day | ORAL | 3 refills | Status: AC

## 2024-05-02 NOTE — Addendum Note (Signed)
 Addended by: Gates Kasal C on: 05/02/2024 11:17 AM   Modules accepted: Orders

## 2024-05-09 ENCOUNTER — Other Ambulatory Visit: Payer: Self-pay | Admitting: Family Medicine

## 2024-05-09 ENCOUNTER — Ambulatory Visit
Admission: RE | Admit: 2024-05-09 | Discharge: 2024-05-09 | Disposition: A | Discharge status: Home / Self Care | Payer: Medicare Other | Source: Ambulatory Visit | Attending: Family Medicine | Admitting: Family Medicine

## 2024-05-09 DIAGNOSIS — N6320 Unspecified lump in the left breast, unspecified quadrant: Secondary | ICD-10-CM

## 2024-05-09 DIAGNOSIS — N6322 Unspecified lump in the left breast, upper inner quadrant: Secondary | ICD-10-CM | POA: Diagnosis not present

## 2024-05-09 DIAGNOSIS — R928 Other abnormal and inconclusive findings on diagnostic imaging of breast: Secondary | ICD-10-CM

## 2024-05-24 ENCOUNTER — Other Ambulatory Visit: Payer: Self-pay | Admitting: Family Medicine

## 2024-05-24 ENCOUNTER — Ambulatory Visit
Admission: RE | Admit: 2024-05-24 | Discharge: 2024-05-24 | Disposition: A | Discharge status: Home / Self Care | Source: Ambulatory Visit | Attending: Family Medicine | Admitting: Family Medicine

## 2024-05-24 DIAGNOSIS — N6322 Unspecified lump in the left breast, upper inner quadrant: Secondary | ICD-10-CM | POA: Diagnosis not present

## 2024-05-24 DIAGNOSIS — N6320 Unspecified lump in the left breast, unspecified quadrant: Secondary | ICD-10-CM

## 2024-05-24 DIAGNOSIS — N6032 Fibrosclerosis of left breast: Secondary | ICD-10-CM | POA: Diagnosis not present

## 2024-05-24 DIAGNOSIS — R92322 Mammographic fibroglandular density, left breast: Secondary | ICD-10-CM | POA: Diagnosis not present

## 2024-05-24 HISTORY — PX: BREAST BIOPSY: SHX20

## 2024-05-25 LAB — SURGICAL PATHOLOGY

## 2024-05-26 ENCOUNTER — Ambulatory Visit
Admission: RE | Admit: 2024-05-26 | Discharge: 2024-05-26 | Disposition: A | Discharge status: Home / Self Care | Source: Ambulatory Visit | Attending: Family Medicine | Admitting: Family Medicine

## 2024-05-26 DIAGNOSIS — N6320 Unspecified lump in the left breast, unspecified quadrant: Secondary | ICD-10-CM

## 2024-05-26 DIAGNOSIS — D0512 Intraductal carcinoma in situ of left breast: Secondary | ICD-10-CM | POA: Diagnosis not present

## 2024-05-26 DIAGNOSIS — R92322 Mammographic fibroglandular density, left breast: Secondary | ICD-10-CM | POA: Diagnosis not present

## 2024-05-26 DIAGNOSIS — N6325 Unspecified lump in the left breast, overlapping quadrants: Secondary | ICD-10-CM | POA: Diagnosis not present

## 2024-05-26 HISTORY — PX: BREAST BIOPSY: SHX20

## 2024-05-27 LAB — SURGICAL PATHOLOGY

## 2024-06-03 ENCOUNTER — Ambulatory Visit: Payer: Self-pay | Admitting: General Surgery

## 2024-06-03 DIAGNOSIS — D0512 Intraductal carcinoma in situ of left breast: Secondary | ICD-10-CM | POA: Diagnosis not present

## 2024-06-07 ENCOUNTER — Other Ambulatory Visit: Payer: Self-pay | Admitting: General Surgery

## 2024-06-07 DIAGNOSIS — D0512 Intraductal carcinoma in situ of left breast: Secondary | ICD-10-CM

## 2024-06-13 NOTE — Progress Notes (Signed)
 Radiation Oncology         [phone] ________________________________  Initial outpatient Consultation  Name: Katrina Lucas MRN: [id number]  Date: 06/14/2024  DOB: 12-16-1941  RR:Rneojwi, [name], MD  Curvin Deward MOULD, MD   REFERRING PHYSICIAN: Curvin Deward MOULD, MD  DIAGNOSIS:    ICD-10-CM   1. 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 female who presented with breast abnormality on the following imaging: bilateral screening mammogram on the date of 05/09/24.  No symptoms, if any, at that time, were exhibited. Ultrasound of breast revealed a  0.5 cm indeterminate mass at the 11 o'clock position of the left breast 5 cm from the nipple, which may represent the mammographic mass. No abnormal appearing left axillary lymph nodes indicated on scan.   In light of findings, she underwent a needle core biopsy on 05/26/24 under the care of Dr. Alphia. Left breast biopsy showed solid and cribriform types, low grade DCIS without necrosis. Microcalcifications present within hyalinized stroma and benign lobule. Tumor measures 6 mm and focally involving intraductal papillomas. Invasive carcinoma not identified. ER status: 99%, positive, strong staining intensity; PR status 85%, positive, strong staining intensity , Her2 status not assessed.  In light of findings, she was referred to Dr. Deward Curvin on 06/03/24. Upon discussion, patient opted to  proceed with a breast lumpectomy which is scheduled on 07/13/24. Patient is also agreeable to undergo radiation therapy post procedure.   Patient reports some soreness to her left breast since the biopsy, but otherwise denies any breast pain, difficulty with left shoulder range of motion, or any other breast specific complaints.   PREVIOUS RADIATION THERAPY: No  PAST MEDICAL HISTORY:  has a past medical history of Allergy, Arthritis, Chicken pox, Colon polyp, Diverticulitis, GERD (gastroesophageal reflux disease), Heart murmur, Hyperlipidemia, and Hypertension.    PAST SURGICAL HISTORY: Past Surgical History:  Procedure Laterality Date   BREAST BIOPSY Left 05/24/2024   US  LT BREAST BX W LOC DEV 1ST LESION IMG BX SPEC US  GUIDE 05/24/2024 [facility]   BREAST BIOPSY Left 05/26/2024   MM LT BREAST BX W LOC DEV 1ST LESION IMAGE BX SPEC STEREO GUIDE 05/26/2024 [facility]    FAMILY HISTORY: family history includes Cancer in her brother and sister.  SOCIAL HISTORY:  reports that she has never smoked. She has never used smokeless tobacco. She reports current alcohol use of about 4.0 standard drinks of alcohol per week. She reports that she does not use drugs.  ALLERGIES: Sulfa antibiotics, Aspirin, and Codeine  MEDICATIONS:  Current Outpatient Medications  Medication Sig Dispense Refill   acetaminophen (TYLENOL) 325 MG tablet Take 650 mg by mouth.      alendronate  (FOSAMAX ) 70 MG tablet TAKE 1 TABLET EVERY 7 DAYS WITH A FULL GLASS OF WATER ON AN EMPTY STOMACH 12 tablet 3   amLODipine  (NORVASC ) 5 MG tablet Take 1 tablet (5 mg total) by mouth  daily. 90 tablet 3   amoxicillin  (AMOXIL ) 500 MG capsule Take 2 capsules (1,000 mg total) by mouth 2 (two) times daily. 40 capsule 0   cetirizine (ZYRTEC) 10 MG tablet Take 10 mg by mouth daily.     LORazepam  (ATIVAN ) 0.5 MG tablet Take 1 tablet (0.5 mg total) by mouth 2 (two) times daily as needed (dizziness). 30 tablet 0    losartan -hydrochlorothiazide (HYZAAR) 100-25 MG tablet Take 1 tablet by mouth daily. 90 tablet 3   Multiple Vitamins-Minerals (CENTRUM SILVER 50+WOMEN PO) Take by mouth.     ondansetron  (ZOFRAN ) 8 MG tablet Take 1 tablet (8 mg total) by mouth every 8 (eight) hours as needed for nausea or vomiting. 15 tablet 0   rosuvastatin  (CRESTOR ) 10 MG tablet Take 1 tablet (10 mg total) by mouth daily. 90 tablet 3   traMADol  (ULTRAM ) 50 MG tablet Take 0.5-1 tablets (25-50 mg total) by mouth every 12 (twelve) hours as needed. 30 tablet 0   UNABLE TO FIND Med Name: Align GI one capsule daily     vitamin B-12 (CYANOCOBALAMIN) 1000 MCG tablet Take by mouth.     No current facility-administered medications for this encounter.    REVIEW OF SYSTEMS: As above in HPI.   PHYSICAL EXAM:  height is 5' 10.5 (1.791 m) and weight is 162 lb (73.5 kg). Her temperature is 97.7 F (36.5 C). Her blood pressure is 156/82 (abnormal) and her pulse is 71. Her respiration is 20 and oxygen saturation is 99%.   General: Alert and oriented, in no acute distress HEENT: Head is normocephalic. Extraocular movements are intact.  Heart: Regular in rate and rhythm with no murmurs, rubs, or gallops. Chest: Clear to auscultation bilaterally, with no rhonchi, wheezes, or rales. Abdomen: Soft, nontender, nondistended, with no rigidity or guarding. Extremities: No cyanosis or edema. Skin: No concerning lesions. Musculoskeletal: symmetric strength and muscle tone throughout. Neurologic: Cranial nerves II through XII are grossly intact. No obvious focalities. Speech is fluent. Coordination is intact. Psychiatric: Judgment and insight are intact. Affect is appropriate.  Breasts: Biopsy lesion in the upper outer breast with associated induration. No other palpable masses appreciated in the breasts or axillae.    ECOG = 0  0 - Asymptomatic (Fully active, able to carry on all predisease activities without restriction)  1 - Symptomatic but  completely ambulatory (Restricted in physically strenuous activity but ambulatory and able to carry out work of a light or sedentary nature. For example, light housework, office work)  2 - Symptomatic, <50% in bed during the day (Ambulatory and capable of all self care but unable to carry out any work activities. Up and about more than 50% of waking hours)  3 - Symptomatic, >50% in bed, but not bedbound (Capable of only limited self-care, confined to bed or chair 50% or more of waking hours)  4 - Bedbound (Completely disabled. Cannot carry on any self-care. Totally confined to bed or chair)  5 - Death   Raylene MM, Creech RH, Tormey DC, et al. [phone]). Toxicity and response criteria of the [facility]. Am. DOROTHA Bridges. Oncol. 5 (6): 649-55   LABORATORY DATA:   CBC    Component Value Date/Time   WBC 7.1 03/28/2024 0959   RBC 4.54 03/28/2024 0959   HGB 13.7 03/28/2024 0959   HCT 40.7 03/28/2024 0959   PLT 286.0 03/28/2024 0959   MCV 89.7 03/28/2024 0959   MCH 29.9 09/17/2020 1108   MCHC 33.6 03/28/2024 0959  RDW 13.1 03/28/2024 0959    CMP     Component Value Date/Time   NA 139 03/28/2024 0959   K 3.8 03/28/2024 0959   CL 103 03/28/2024 0959   CO2 28 03/28/2024 0959   GLUCOSE 92 03/28/2024 0959   BUN 18 03/28/2024 0959   CREATININE 0.79 03/28/2024 0959   CREATININE 0.80 09/17/2020 1108   CALCIUM  10.1 03/28/2024 0959   PROT 7.0 09/28/2023 0840   ALBUMIN 4.4 09/28/2023 0840   AST 18 09/28/2023 0840   ALT 14 09/28/2023 0840   ALKPHOS 41 09/28/2023 0840   BILITOT 0.6 09/28/2023 0840   GFR 68.51 03/28/2024 0959      RADIOGRAPHY: MM LT BREAST BX W LOC DEV 1ST LESION IMAGE BX SPEC STEREO GUIDE Addendum Date: 06/02/2024 ADDENDUM REPORT: 06/02/2024 14:55 ADDENDUM: Pathology revealed LOW GRADE DUCTAL CARCINOMA IN SITU, SOLID AND CRIBRIFORM TYPES, WITHOUT NECROSIS, FOCALLY INVOLVING INTRADUCTAL PAPILLOMAS, MICROCALCIFICATIONS PRESENT WITHIN HYALINIZED STROMA  AND BENIGN LOBULE of the LEFT breast, upper central, (coil clip). This was found to be concordant by Dr. Inocente Ast. Pathology results were discussed with the patient by telephone. The patient reported doing well after the biopsy with no tenderness at the site. Post biopsy instructions and care were reviewed and questions were answered. The patient was encouraged to call [facility] for any additional concerns. My direct phone number was provided. Surgical consultation has been arranged with Dr. Deward Null at [facility] on June 03, 2024. Pathology results reported by Hendricks Benders, RN on 05/30/2024. Electronically Signed   By: Inocente Ast M.D.   On: 06/02/2024 14:55   Result Date: 06/02/2024 CLINICAL DATA:  83 year old female presenting for biopsy of a mass in the left breast without sonographic correlate. EXAM: LEFT BREAST STEREOTACTIC CORE NEEDLE BIOPSY COMPARISON:  Previous exam(s). FINDINGS: The patient and I discussed the procedure of stereotactic-guided biopsy including benefits and alternatives. We discussed the high likelihood of a successful procedure. We discussed the risks of the procedure including infection, bleeding, tissue injury, clip migration, and inadequate sampling. Informed written consent was given. The usual time out protocol was performed immediately prior to the procedure. Using sterile technique and 1% Lidocaine  as local anesthetic, under stereotactic guidance, a 9 gauge vacuum assisted device was used to perform core needle biopsy of a mass in the upper central left breast using a superior approach. Lesion quadrant: Upper inner quadrant At the conclusion of the procedure, a coil shaped tissue marker clip was deployed into the biopsy cavity. Follow-up 2-view mammogram was performed and dictated separately. IMPRESSION: Stereotactic-guided biopsy of a mass in the upper central left breast. No apparent complications. Electronically Signed:  By: Inocente Ast M.D. On: 05/26/2024 10:13   MM CLIP PLACEMENT LEFT Result Date: 05/26/2024 CLINICAL DATA:  Post procedure mammogram for clip placement EXAM: 3D DIAGNOSTIC LEFT MAMMOGRAM POST STEREOTACTIC BIOPSY COMPARISON:  Previous exam(s). ACR Breast Density Category b: There are scattered areas of fibroglandular density. FINDINGS: 3D Mammographic images were obtained following stereotactic guided biopsy of a small mass in the upper central left breast. The coil biopsy marking clip is in expected position at the site of biopsy. IMPRESSION: Appropriate positioning of the coil shaped biopsy marking clip at the site of biopsy in the upper central left breast. Final Assessment: Post Procedure Mammograms for Marker Placement Electronically Signed   By: Inocente Ast M.D.   On: 05/26/2024 10:12   US  LT BREAST BX W LOC DEV 1ST LESION IMG BX SPEC  US  GUIDE Addendum Date: 05/25/2024 ADDENDUM REPORT: 05/25/2024 12:09 ADDENDUM: Pathology revealed BENIGN MAMMARY DUCT WITH PERIDUCTAL DENSE FIBROSIS of the LEFT breast, 11 o'clock, 5 CMFN, (ribbon clip). This was found to be concordant by Dr. Aliene Mir. Pathology results were discussed with the patient by telephone. The patient reported doing well after the biopsy with no tenderness at the site. Post biopsy instructions and care were reviewed and questions were answered. The patient was encouraged to call [facility] for any additional concerns. My direct phone number was provided. The patient is scheduled for a LEFT breast stereotactic guided biopsy on May 26, 2024. Further recommendations will be guided by the results of this biopsy. Pathology results reported by Hendricks Benders, RN on 05/25/2024. Electronically Signed   By: Aliene Lloyd M.D.   On: 05/25/2024 12:09   Result Date: 05/25/2024 CLINICAL DATA:  83 year old woman with indeterminate 0.5 cm LEFT breast mass 11 o'clock 5 CMFN presents for ultrasound-guided core needle biopsy.  EXAM: ULTRASOUND GUIDED LEFT BREAST CORE NEEDLE BIOPSY COMPARISON:  Previous exam(s). PROCEDURE: I met with the patient and we discussed the procedure of ultrasound-guided biopsy, including benefits and alternatives. We discussed the high likelihood of a successful procedure. We discussed the risks of the procedure, including infection, bleeding, tissue injury, clip migration, and inadequate sampling. Informed written consent was given. The usual time-out protocol was performed immediately prior to the procedure. Lesion quadrant: Upper inner quadrant Using sterile technique and 1% Lidocaine  as local anesthetic, under direct ultrasound visualization, a 14 gauge spring-loaded device was used to perform biopsy of LEFT breast mass 11 o'clock 5 CMFN using a lateral approach. At the conclusion of the procedure ribbon shaped tissue marker clip was deployed into the biopsy cavity. Follow up 2 view mammogram was performed and dictated separately. IMPRESSION: Ultrasound guided biopsy of 0.5 cm LEFT breast mass 11 o'clock 5 CMFN. No apparent complications. Electronically Signed: By: Aliene Lloyd M.D. On: 05/24/2024 16:39   MM CLIP PLACEMENT LEFT Result Date: 05/24/2024 CLINICAL DATA:  Status post ultrasound-guided core needle biopsy of LEFT breast mass EXAM: 3D DIAGNOSTIC LEFT MAMMOGRAM POST ULTRASOUND BIOPSY COMPARISON:  Previous exam(s). ACR Breast Density Category c: The breasts are heterogeneously dense, which may obscure small masses. FINDINGS: 3D Mammographic images were obtained following ultrasound guided biopsy of 0.5 cm LEFT breast mass at 11 o'clock 5 CMFN. The biopsy marking clip is in expected position at the site of biopsy. IMPRESSION: 1. Appropriate positioning of the ribbon shaped biopsy marking clip at the site of biopsy in the upper inner LEFT breast. 2. Please note that this biopsied mass does not correspond with the mammographic mass seen on diagnostic workup in the LEFT breast at middle depth. The  patient will be scheduled for stereotactic guided biopsy of this mammographic mass. Final Assessment: Post Procedure Mammograms for Marker Placement Electronically Signed   By: Aliene Lloyd M.D.   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 One option would be antiestrogen therapy as discussed with medical oncology. Another option is adjuvant radiation.     We discussed the risks benefits and side effects of radiotherapy. She understands that the side effects would likely include some skin irritation and fatigue during the weeks of radiation. There is a risk of late effects which  include but are not necessarily limited to cosmetic changes and rare lung toxicity. I would anticipate delivering approximately 1-4 weeks of radiotherapy    After a thorough discussion of standard hypofractionation over 3-4 weeks, we discussed 1 week of ultra hypofractionated radiation therapy. I discussed that this approach is a bit less standard than longer regimens.  The Fast Forward trial (1 week) method is a nevertheless a reasonable approach to consider and the vast majority of patients do very well. There is also an option of RT once weekly for 5 weeks that we discussed.   At the end of our discussion, the patient expressed interested in 1 week of ultra hypofractionated radiation therapy and would rather not take a pill for several years, but she still needs to consult with med/onc. She is scheduled to meet with Dr. Lanny later this afternoon to discuss antiestrogen treatment.    We spoke about other general acute effects of breast radiation including skin irritation and fatigue as well as breast fibrosis, induration, and /or swelling long term, and much less common late effects including internal organ injury or irritation. We spoke about the  latest technology that is used to minimize the risk of late effects for patients undergoing radiotherapy to the breast or chest wall. No guarantees of treatment were given. The patient is enthusiastic about proceeding with treatment.  I look forward to participating in the patient's care.  I will await her referral back to me for postoperative follow-up and eventual CT simulation/treatment planning.       On date of service, in total, we spent 60 minutes on this encounter. Patient was seen in person.   __________________________________________   Leeroy Due, PA-C   Lauraine Golden, MD    [facility]  Radiation Oncology Direct Dial: [phone]  Fax: [phone] [email or web address]   This document serves as a record of services personally performed by Lauraine Golden, MD. It was created on her behalf by Reymundo Cartwright, a trained medical scribe. The creation of this record is based on the scribe's personal observations and the provider's statements to them. This document has been checked and approved by the attending provider.

## 2024-06-13 NOTE — Progress Notes (Incomplete)
 Radiation Oncology         [phone] ________________________________  Initial outpatient Consultation  Name: Katrina Lucas MRN: [id number]  Date: 06/14/2024  DOB: 06-28-1941  RR:Rneojwi, [name], MD  Curvin Deward MOULD, MD   REFERRING PHYSICIAN: Curvin Deward III, MD  DIAGNOSIS: No diagnosis found.   Cancer Staging  No matching staging information was found for the patient.   Stage *** *** Breast ***Q *** Carcinoma ***, ER*** / PR*** / Her2***, Grade ***  CHIEF COMPLAINT: Here to discuss management of left breast cancer  HISTORY OF PRESENT ILLNESS::Katrina Lucas is a 83 y.o. female who presented with breast abnormality on the following imaging: bilateral screening mammogram on the date of 05/09/24.  No symptoms, if any, at that time, were exhibited.   Ultrasound of breast revealed a  0.5 cm indeterminate mass at the 11 o'clock position of the left breast 5 cm from the nipple, which may represent the mammographic mass. No abnormal appearing left axillary lymph nodes indicated on scan.   In light of findings, she underwent a needle core biopsy under the care of Dr. Lanae   Biopsy on date of *** showed ***.  ER status: ***; PR status ***, Her2 status ***; Grade ***.  ***  PREVIOUS RADIATION THERAPY: {EXAM; YES/NO:19492::No}  PAST MEDICAL HISTORY:  has a past medical history of Allergy, Arthritis, Chicken pox, Colon polyp, Diverticulitis, GERD (gastroesophageal reflux disease), Heart murmur, Hyperlipidemia, and Hypertension.    PAST SURGICAL HISTORY: Past Surgical History:  Procedure Laterality Date  . BREAST BIOPSY Left 05/24/2024   US  LT BREAST BX W LOC DEV 1ST LESION IMG BX SPEC US  GUIDE 05/24/2024 [facility]  . BREAST BIOPSY Left 05/26/2024   MM LT BREAST BX W LOC DEV 1ST LESION IMAGE BX SPEC STEREO GUIDE 05/26/2024 [facility]    FAMILY HISTORY: family history is not on file.  SOCIAL HISTORY:  reports that she has never smoked. She has never used  smokeless tobacco. She reports current alcohol use. She reports that she does not use drugs.  ALLERGIES: Sulfa antibiotics, Aspirin, and Codeine  MEDICATIONS:  Current Outpatient Medications  Medication Sig Dispense Refill  . acetaminophen (TYLENOL) 325 MG tablet Take 650 mg by mouth.     . alendronate  (FOSAMAX ) 70 MG tablet TAKE 1 TABLET EVERY 7 DAYS WITH A FULL GLASS OF WATER ON AN EMPTY STOMACH 12 tablet 3  . amLODipine  (NORVASC ) 5 MG tablet Take 1 tablet (5 mg total) by mouth daily. 90 tablet 3  . amoxicillin  (AMOXIL ) 500 MG capsule Take 2 capsules (1,000 mg total) by mouth 2 (two) times daily. 40 capsule 0  . cetirizine (ZYRTEC) 10 MG tablet Take 10 mg by mouth daily.    . LORazepam  (ATIVAN ) 0.5 MG tablet Take 1 tablet (0.5 mg total) by mouth 2 (two) times daily as needed (dizziness). 30 tablet 0  . losartan -hydrochlorothiazide (HYZAAR) 100-25 MG tablet Take 1 tablet by mouth daily. 90 tablet 3  . Multiple Vitamins-Minerals (CENTRUM SILVER 50+WOMEN PO) Take by mouth.    . ondansetron  (ZOFRAN ) 8 MG tablet Take 1 tablet (8 mg total) by mouth every 8 (eight) hours as needed for nausea or vomiting. 15 tablet 0  . rosuvastatin  (CRESTOR ) 10 MG tablet Take 1 tablet (10 mg total) by mouth daily. 90 tablet 3  . traMADol  (ULTRAM ) 50 MG tablet Take 0.5-1 tablets (25-50 mg total) by mouth every 12 (twelve) hours as needed. 30 tablet 0  . UNABLE TO FIND Med  Name: Align GI one capsule daily    . vitamin B-12 (CYANOCOBALAMIN) 1000 MCG tablet Take by mouth.     No current facility-administered medications for this encounter.    REVIEW OF SYSTEMS: As above in HPI.   PHYSICAL EXAM:  vitals were not taken for this visit.   General: Alert and oriented, in no acute distress HEENT: Head is normocephalic. Extraocular movements are intact.  Heart: Regular in rate and rhythm with no murmurs, rubs, or gallops. Chest: Clear to auscultation bilaterally, with no rhonchi, wheezes, or rales. Abdomen: Soft,  nontender, nondistended, with no rigidity or guarding. Extremities: No cyanosis or edema. Skin: No concerning lesions. Musculoskeletal: symmetric strength and muscle tone throughout. Neurologic: Cranial nerves II through XII are grossly intact. No obvious focalities. Speech is fluent. Coordination is intact. Psychiatric: Judgment and insight are intact. Affect is appropriate. Breasts: *** . No other palpable masses appreciated in the breasts or axillae *** .    ECOG = ***  0 - Asymptomatic (Fully active, able to carry on all predisease activities without restriction)  1 - Symptomatic but completely ambulatory (Restricted in physically strenuous activity but ambulatory and able to carry out work of a light or sedentary nature. For example, light housework, office work)  2 - Symptomatic, <50% in bed during the day (Ambulatory and capable of all self care but unable to carry out any work activities. Up and about more than 50% of waking hours)  3 - Symptomatic, >50% in bed, but not bedbound (Capable of only limited self-care, confined to bed or chair 50% or more of waking hours)  4 - Bedbound (Completely disabled. Cannot carry on any self-care. Totally confined to bed or chair)  5 - Death   Raylene MM, Creech RH, Tormey DC, et al. [phone]). Toxicity and response criteria of the [facility]. Am. DOROTHA Bridges. Oncol. 5 (6): 649-55   LABORATORY DATA:   CBC    Component Value Date/Time   WBC 7.1 03/28/2024 0959   RBC 4.54 03/28/2024 0959   HGB 13.7 03/28/2024 0959   HCT 40.7 03/28/2024 0959   PLT 286.0 03/28/2024 0959   MCV 89.7 03/28/2024 0959   MCH 29.9 09/17/2020 1108   MCHC 33.6 03/28/2024 0959   RDW 13.1 03/28/2024 0959    CMP     Component Value Date/Time   NA 139 03/28/2024 0959   K 3.8 03/28/2024 0959   CL 103 03/28/2024 0959   CO2 28 03/28/2024 0959   GLUCOSE 92 03/28/2024 0959   BUN 18 03/28/2024 0959   CREATININE 0.79 03/28/2024 0959   CREATININE  0.80 09/17/2020 1108   CALCIUM  10.1 03/28/2024 0959   PROT 7.0 09/28/2023 0840   ALBUMIN 4.4 09/28/2023 0840   AST 18 09/28/2023 0840   ALT 14 09/28/2023 0840   ALKPHOS 41 09/28/2023 0840   BILITOT 0.6 09/28/2023 0840   GFR 68.51 03/28/2024 0959      RADIOGRAPHY: MM LT BREAST BX W LOC DEV 1ST LESION IMAGE BX SPEC STEREO GUIDE Addendum Date: 06/02/2024 ADDENDUM REPORT: 06/02/2024 14:55 ADDENDUM: 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 This was found to be concordant by Dr. Inocente Ast. Pathology results were discussed with the patient by telephone. The patient reported doing well after the biopsy with no tenderness at the site. Post biopsy instructions and care were  reviewed and questions were answered. The patient was encouraged to call [facility] for any additional concerns. My direct phone number was provided. Surgical consultation has been arranged with Dr. Deward Null at [facility] on June 03, 2024. Pathology results reported by Hendricks Benders, RN on 05/30/2024. Electronically Signed   By: Inocente Ast M.D.   On: 06/02/2024 14:55   Result Date: 06/02/2024 CLINICAL DATA:  83 year old female presenting for biopsy of a mass in the left breast without sonographic correlate. EXAM: LEFT BREAST STEREOTACTIC CORE NEEDLE BIOPSY COMPARISON:  Previous exam(s). FINDINGS: The patient and I discussed the procedure of stereotactic-guided biopsy including benefits and alternatives. We discussed the high likelihood of a successful procedure. We discussed the risks of the procedure including infection, bleeding, tissue injury, clip migration, and inadequate sampling. Informed written consent was given. The usual time out protocol was performed  immediately prior to the procedure. Using sterile technique and 1% Lidocaine  as local anesthetic, under stereotactic guidance, a 9 gauge vacuum assisted device was used to perform core needle biopsy of a mass in the upper central left breast using a superior approach. Lesion quadrant: Upper inner quadrant At the conclusion of the procedure, a coil shaped tissue marker clip was deployed into the biopsy cavity. Follow-up 2-view mammogram was performed and dictated separately. IMPRESSION: Stereotactic-guided biopsy of a mass in the upper central left breast. No apparent complications. Electronically Signed: By: Inocente Ast M.D. On: 05/26/2024 10:13   MM CLIP PLACEMENT LEFT Result Date: 05/26/2024 CLINICAL DATA:  Post procedure mammogram for clip placement EXAM: 3D DIAGNOSTIC LEFT MAMMOGRAM POST STEREOTACTIC BIOPSY COMPARISON:  Previous exam(s). ACR Breast Density Category b: There are scattered areas of fibroglandular density. FINDINGS: 3D Mammographic images were obtained following stereotactic guided biopsy of a small mass in the upper central left breast. The coil biopsy marking clip is in expected position at the site of biopsy. IMPRESSION: Appropriate positioning of the coil shaped biopsy marking clip at the site of biopsy in the upper central left breast. Final Assessment: Post Procedure Mammograms for Marker Placement Electronically Signed   By: Inocente Ast M.D.   On: 05/26/2024 10:12   US  LT BREAST BX W LOC DEV 1ST LESION IMG BX SPEC US  GUIDE Addendum Date: 05/25/2024 ADDENDUM REPORT: 05/25/2024 12:09 ADDENDUM: Pathology revealed BENIGN MAMMARY DUCT WITH PERIDUCTAL DENSE FIBROSIS of the LEFT breast, 11 o'clock, 5 CMFN, (ribbon clip). This was found to be concordant by Dr. Aliene Mir. Pathology results were discussed with the patient by telephone. The patient reported doing well after the biopsy with no tenderness at the site. Post biopsy instructions and care were reviewed and questions were  answered. The patient was encouraged to call [facility] for any additional concerns. My direct phone number was provided. The patient is scheduled for a LEFT breast stereotactic guided biopsy on May 26, 2024. Further recommendations will be guided by the results of this biopsy. Pathology results reported by Hendricks Benders, RN on 05/25/2024. Electronically Signed   By: Aliene Lloyd M.D.   On: 05/25/2024 12:09   Result Date: 05/25/2024 CLINICAL DATA:  83 year old woman with indeterminate 0.5 cm LEFT breast mass 11 o'clock 5 CMFN presents for ultrasound-guided core needle biopsy. EXAM: ULTRASOUND GUIDED LEFT BREAST CORE NEEDLE BIOPSY COMPARISON:  Previous exam(s). PROCEDURE: I met with the patient and we discussed the procedure of ultrasound-guided biopsy, including benefits and alternatives. We discussed the high likelihood of a successful procedure. We discussed  the risks of the procedure, including infection, bleeding, tissue injury, clip migration, and inadequate sampling. Informed written consent was given. The usual time-out protocol was performed immediately prior to the procedure. Lesion quadrant: Upper inner quadrant Using sterile technique and 1% Lidocaine  as local anesthetic, under direct ultrasound visualization, a 14 gauge spring-loaded device was used to perform biopsy of LEFT breast mass 11 o'clock 5 CMFN using a lateral approach. At the conclusion of the procedure ribbon shaped tissue marker clip was deployed into the biopsy cavity. Follow up 2 view mammogram was performed and dictated separately. IMPRESSION: Ultrasound guided biopsy of 0.5 cm LEFT breast mass 11 o'clock 5 CMFN. No apparent complications. Electronically Signed: By: Aliene Lloyd M.D. On: 05/24/2024 16:39   MM CLIP PLACEMENT LEFT Result Date: 05/24/2024 CLINICAL DATA:  Status post ultrasound-guided core needle biopsy of LEFT breast mass EXAM: 3D DIAGNOSTIC LEFT MAMMOGRAM POST ULTRASOUND BIOPSY COMPARISON:   Previous exam(s). ACR Breast Density Category c: The breasts are heterogeneously dense, which may obscure small masses. FINDINGS: 3D Mammographic images were obtained following ultrasound guided biopsy of 0.5 cm LEFT breast mass at 11 o'clock 5 CMFN. The biopsy marking clip is in expected position at the site of biopsy. IMPRESSION: 1. Appropriate positioning of the ribbon shaped biopsy marking clip at the site of biopsy in the upper inner LEFT breast. 2. Please note that this biopsied mass does not correspond with the mammographic mass seen on diagnostic workup in the LEFT breast at middle depth. The patient will be scheduled for stereotactic guided biopsy of this mammographic mass. Final Assessment: Post Procedure Mammograms for Marker Placement Electronically Signed   By: Aliene Lloyd M.D.   On: 05/24/2024 16:42      IMPRESSION/PLAN: ***   It was a pleasure meeting the patient today. We discussed the risks, benefits, and side effects of radiotherapy. I recommend radiotherapy to the *** to reduce her risk of locoregional recurrence by 2/3.  We discussed that radiation would take approximately *** weeks to complete and that I would give the patient a few weeks to heal following surgery before starting treatment planning. *** If chemotherapy were to be given, this would precede radiotherapy. We spoke about acute effects including skin irritation and fatigue as well as much less common late effects including internal organ injury or irritation. We spoke about the latest technology that is used to minimize the risk of late effects for patients undergoing radiotherapy to the breast or chest wall. No guarantees of treatment were given. The patient is enthusiastic about proceeding with treatment. I look forward to participating in the patient's care.  I will await her referral back to me for postoperative follow-up and eventual CT simulation/treatment planning.  On date of service, in total, I spent *** minutes on  this encounter. Patient was seen in person.   __________________________________________   Lauraine Golden, MD

## 2024-06-13 NOTE — Progress Notes (Signed)
 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 None Pacemaker/ICD? None Possible current pregnancy?N/A Is the patient on methotrexate? None  Current Complaints / other details:   None BP (!) 156/82 (BP Location: Left Arm, Patient Position: Sitting, Cuff Size: Large)   Pulse 71   Temp 97.7 F (36.5 C)   Resp 20   Ht 5' 10.5 (1.791 m)   Wt 162 lb (73.5 kg)   SpO2 99%   BMI 22.92 kg/m      Wt Readings from Last 3 Encounters:  06/14/24 162 lb (73.5 kg)  03/28/24 163 lb (73.9 kg)  09/28/23 161 lb (73 kg)

## 2024-06-14 ENCOUNTER — Ambulatory Visit
Admission: RE | Admit: 2024-06-14 | Discharge: 2024-06-14 | Disposition: A | Discharge status: Home / Self Care | Source: Ambulatory Visit | Attending: Radiation Oncology | Admitting: Radiation Oncology

## 2024-06-14 ENCOUNTER — Encounter: Payer: Self-pay | Admitting: Hematology

## 2024-06-14 ENCOUNTER — Inpatient Hospital Stay: Attending: Hematology | Admitting: Hematology

## 2024-06-14 ENCOUNTER — Inpatient Hospital Stay

## 2024-06-14 VITALS — BP 138/70 | HR 86 | Temp 97.8°F | Resp 16 | Ht 70.5 in | Wt 162.0 lb

## 2024-06-14 VITALS — BP 156/82 | HR 71 | Temp 97.7°F | Resp 20 | Ht 70.5 in | Wt 162.0 lb

## 2024-06-14 DIAGNOSIS — E785 Hyperlipidemia, unspecified: Secondary | ICD-10-CM | POA: Insufficient documentation

## 2024-06-14 DIAGNOSIS — K219 Gastro-esophageal reflux disease without esophagitis: Secondary | ICD-10-CM | POA: Diagnosis not present

## 2024-06-14 DIAGNOSIS — R011 Cardiac murmur, unspecified: Secondary | ICD-10-CM | POA: Diagnosis not present

## 2024-06-14 DIAGNOSIS — D0512 Intraductal carcinoma in situ of left breast: Secondary | ICD-10-CM | POA: Diagnosis not present

## 2024-06-14 DIAGNOSIS — Z170 Estrogen receptor positive status [ER+]: Secondary | ICD-10-CM | POA: Insufficient documentation

## 2024-06-14 DIAGNOSIS — M129 Arthropathy, unspecified: Secondary | ICD-10-CM | POA: Diagnosis not present

## 2024-06-14 DIAGNOSIS — Z79899 Other long term (current) drug therapy: Secondary | ICD-10-CM | POA: Insufficient documentation

## 2024-06-14 DIAGNOSIS — I10 Essential (primary) hypertension: Secondary | ICD-10-CM | POA: Diagnosis not present

## 2024-06-14 NOTE — Progress Notes (Signed)
 [facility]   [phone] [phone]   Clinic New Consult Note   Patient Care Team: Copland, [name], MD as PCP - General (Family Medicine) 06/14/2024  CHIEF COMPLAINTS/PURPOSE OF CONSULTATION:  Newly diagnosed left breast DCIS  REFERRING PHYSICIAN: Breast center  Discussed the use of AI scribe software for clinical note transcription with the patient, who gave verbal consent to proceed.  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  DCIS in the left breast was diagnosed following a mammogram on May 09, 2024, which showed a 0.5 cm mass in the left breast. A biopsy confirmed DCIS, low-grade, both ER and PR strongly positive. She has no palpable lumps, pain, or nipple discharge. This is her first breast biopsy and cancer diagnosis.  Her medical history includes arthritis with significant back and occasional knee pain, hypertension, hyperlipidemia, a longstanding heart murmur, acid reflux, and neuropathy in both feet. She uses a cane for mobility and takes B12 supplements.  Family history includes a brother with thyroid  cancer and a sister with Hodgkin's lymphoma and bladder cancer. There is no breast cancer in the family.  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 She consumes alcohol three to four times a week and has never smoked. She is hard of hearing and has historically had biennial mammograms, now transitioning to annual screenings.     MEDICAL HISTORY:  Past Medical History:  Diagnosis Date   Allergy    Arthritis    Chicken pox    Colon polyp    Diverticulitis    GERD (gastroesophageal reflux disease)    Heart murmur    Hyperlipidemia    Hypertension     SURGICAL HISTORY: Past Surgical History:   Procedure Laterality Date   BREAST BIOPSY Left 05/24/2024   US  LT BREAST BX W LOC DEV 1ST LESION IMG BX SPEC US  GUIDE 05/24/2024 [facility]   BREAST BIOPSY Left 05/26/2024   MM LT BREAST BX W LOC DEV 1ST LESION IMAGE BX SPEC STEREO GUIDE 05/26/2024 [facility]    SOCIAL HISTORY: Social History   Socioeconomic History   Marital status: Single    Spouse name: Not on file   Number of children: Not on file   Years of education: Not on file   Highest education level: Doctorate  Occupational History   Not on file  Tobacco Use   Smoking status: Never   Smokeless tobacco: Never  Substance and Sexual Activity   Alcohol use: Yes    Alcohol/week: 4.0 standard drinks of alcohol    Types: 4 Shots of liquor per week    Comment: 5 times a week   Drug use: Never   Sexual activity: Not on file  Other Topics Concern   Not on file  Social History Narrative   Not on file   Social Drivers of Health   Financial Resource Strain: Low Risk  (03/25/2024)   Overall Financial Resource Strain (CARDIA)    Difficulty of Paying Living Expenses: Not hard at all  Food Insecurity: No Food Insecurity (06/14/2024)   Hunger Vital Sign    Worried About Running Out of Food in the Last Year: Never true    Ran Out of Food in the Last Year: Never true  Transportation Needs: No Transportation  Needs (06/14/2024)   PRAPARE - Administrator, Civil Service (Medical): No    Lack of Transportation (Non-Medical): No  Physical Activity: Insufficiently Active (03/25/2024)   Exercise Vital Sign    Days of Exercise per Week: 2 days    Minutes of Exercise per Session: 40 min  Stress: No Stress Concern Present (03/25/2024)   [employer] of Occupational Health - Occupational Stress Questionnaire    Feeling of Stress : Not at all  Social Connections: Socially Isolated (03/25/2024)   Social Connection and Isolation Panel    Frequency of Communication with Friends and Family: Once a week    Frequency of  Social Gatherings with Friends and Family: Once a week    Attends Religious Services: Never    Database administrator or Organizations: No    Attends Engineer, structural: Not on file    Marital Status: Never married  Intimate Partner Violence: Not At Risk (06/14/2024)   Humiliation, Afraid, Rape, and Kick questionnaire    Fear of Current or Ex-Partner: No    Emotionally Abused: No    Physically Abused: No    Sexually Abused: No    FAMILY HISTORY: Family History  Problem Relation Age of Onset   Cancer Sister        bladder cancer and lymphoma   Cancer Brother        thyroid  cancer    ALLERGIES:  is allergic to sulfa antibiotics, aspirin, and codeine.  MEDICATIONS:  Current Outpatient Medications  Medication Sig Dispense Refill   acetaminophen (TYLENOL) 325 MG tablet Take 650 mg by mouth.      alendronate  (FOSAMAX ) 70 MG tablet TAKE 1 TABLET EVERY 7 DAYS WITH A FULL GLASS OF WATER ON AN EMPTY STOMACH 12 tablet 3   amLODipine  (NORVASC ) 5 MG tablet Take 1 tablet (5 mg total) by mouth daily. 90 tablet 3   amoxicillin  (AMOXIL ) 500 MG capsule Take 2 capsules (1,000 mg total) by mouth 2 (two) times daily. 40 capsule 0   cetirizine (ZYRTEC) 10 MG tablet Take 10 mg by mouth daily.     LORazepam  (ATIVAN ) 0.5 MG tablet Take 1 tablet (0.5 mg total) by mouth 2 (two) times daily as needed (dizziness). 30 tablet 0   losartan -hydrochlorothiazide (HYZAAR) 100-25 MG tablet Take 1 tablet by mouth daily. 90 tablet 3   Multiple Vitamins-Minerals (CENTRUM SILVER 50+WOMEN PO) Take by mouth.     ondansetron  (ZOFRAN ) 8 MG tablet Take 1 tablet (8 mg total) by mouth every 8 (eight) hours as needed for nausea or vomiting. 15 tablet 0   rosuvastatin  (CRESTOR ) 10 MG tablet Take 1 tablet (10 mg total) by mouth daily. 90 tablet 3   traMADol  (ULTRAM ) 50 MG tablet Take 0.5-1 tablets (25-50 mg total) by mouth every 12 (twelve) hours as needed. 30 tablet 0   UNABLE TO FIND Med Name: Align GI one capsule  daily     vitamin B-12 (CYANOCOBALAMIN) 1000 MCG tablet Take by mouth.     No current facility-administered medications for this visit.    REVIEW OF SYSTEMS:   Constitutional: Denies fevers, chills or abnormal night sweats Eyes: Denies blurriness of vision, double vision or watery eyes Ears, nose, mouth, throat, and face: Denies mucositis or sore throat Respiratory: Denies cough, dyspnea or wheezes Cardiovascular: Denies palpitation, chest discomfort or lower extremity swelling Gastrointestinal:  Denies nausea, heartburn or change in bowel habits Skin: Denies abnormal skin rashes Lymphatics: Denies new lymphadenopathy or easy bruising Neurological:Denies  numbness, tingling or new weaknesses Behavioral/Psych: Mood is stable, no new changes  All other systems were reviewed with the patient and are negative.  PHYSICAL EXAMINATION: ECOG PERFORMANCE STATUS: 1 - Symptomatic but completely ambulatory  Vitals:   06/14/24 1126  BP: 138/70  Pulse: 86  Resp: 16  Temp: 97.8 F (36.6 C)  SpO2: 99%   Filed Weights   06/14/24 1126  Weight: 162 lb (73.5 kg)    GENERAL:alert, no distress and comfortable SKIN: skin color, texture, turgor are normal, no rashes or significant lesions EYES: normal, conjunctiva are pink and non-injected, sclera clear OROPHARYNX:no exudate, no erythema and lips, buccal mucosa, and tongue normal  NECK: supple, thyroid  normal size, non-tender, without nodularity LYMPH:  no palpable lymphadenopathy in the cervical, axillary or inguinal LUNGS: clear to auscultation and percussion with normal breathing effort HEART: regular rate & rhythm and no murmurs and no lower extremity edema ABDOMEN:abdomen soft, non-tender and normal bowel sounds Musculoskeletal:no cyanosis of digits and no clubbing  PSYCH: alert & oriented x 3 with fluent speech NEURO: no focal motor/sensory deficits  Physical Exam    LABORATORY DATA:  I have reviewed the data as listed    Latest  Ref Rng & Units 03/28/2024    9:59 AM 09/28/2023    8:40 AM 03/25/2023    9:13 AM  CBC  WBC 4.0 - 10.5 K/uL 7.1  8.4  8.9   Hemoglobin 12.0 - 15.0 g/dL 86.2  86.0  85.8   Hematocrit 36.0 - 46.0 % 40.7  42.9  41.7   Platelets 150.0 - 400.0 K/uL 286.0  271.0  252.0     @cmpl @  RADIOGRAPHIC STUDIES: I have personally reviewed the radiological images as listed and agreed with the findings in the report. MM LT BREAST BX W LOC DEV 1ST LESION IMAGE BX SPEC STEREO GUIDE Addendum Date: 06/02/2024 ADDENDUM REPORT: 06/02/2024 14:55 ADDENDUM: 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 This was found to be concordant by Dr. Inocente Ast. Pathology results were discussed with the patient by telephone. The patient reported doing well after the biopsy with no tenderness at the site. Post biopsy instructions and care were reviewed and questions were answered. The patient was encouraged to call [facility] for any additional concerns. My direct phone number was provided. Surgical consultation has been arranged with Dr. Deward Null at [facility] on June 03, 2024. Pathology results reported by Hendricks Benders, RN on 05/30/2024. Electronically Signed   By: Inocente Ast M.D.   On: 06/02/2024 14:55   Result Date: 06/02/2024 CLINICAL DATA:  83 year old female presenting for biopsy of a mass in the left breast without sonographic correlate. EXAM: LEFT BREAST STEREOTACTIC CORE NEEDLE BIOPSY COMPARISON:  Previous exam(s). FINDINGS: The patient and I discussed the procedure of stereotactic-guided biopsy including benefits and alternatives. We discussed the high likelihood of a successful procedure. We discussed the risks of the procedure including infection, bleeding,  tissue injury, clip migration, and inadequate sampling. Informed written consent was given. The usual time out protocol was performed immediately prior to the procedure. Using sterile technique and 1% Lidocaine  as local anesthetic, under stereotactic guidance, a 9 gauge vacuum assisted device was used to perform core needle biopsy of a mass in the upper central left breast using a superior approach. Lesion quadrant: Upper inner  quadrant At the conclusion of the procedure, a coil shaped tissue marker clip was deployed into the biopsy cavity. Follow-up 2-view mammogram was performed and dictated separately. IMPRESSION: Stereotactic-guided biopsy of a mass in the upper central left breast. No apparent complications. Electronically Signed: By: Inocente Ast M.D. On: 05/26/2024 10:13   MM CLIP PLACEMENT LEFT Result Date: 05/26/2024 CLINICAL DATA:  Post procedure mammogram for clip placement EXAM: 3D DIAGNOSTIC LEFT MAMMOGRAM POST STEREOTACTIC BIOPSY COMPARISON:  Previous exam(s). ACR Breast Density Category b: There are scattered areas of fibroglandular density. FINDINGS: 3D Mammographic images were obtained following stereotactic guided biopsy of a small mass in the upper central left breast. The coil biopsy marking clip is in expected position at the site of biopsy. IMPRESSION: Appropriate positioning of the coil shaped biopsy marking clip at the site of biopsy in the upper central left breast. Final Assessment: Post Procedure Mammograms for Marker Placement Electronically Signed   By: Inocente Ast M.D.   On: 05/26/2024 10:12   US  LT BREAST BX W LOC DEV 1ST LESION IMG BX SPEC US  GUIDE Addendum Date: 05/25/2024 ADDENDUM REPORT: 05/25/2024 12:09 ADDENDUM: Pathology revealed BENIGN MAMMARY DUCT WITH PERIDUCTAL DENSE FIBROSIS of the LEFT breast, 11 o'clock, 5 CMFN, (ribbon clip). This was found to be concordant by Dr. Aliene Mir. Pathology results were discussed with the patient by telephone. The patient  reported doing well after the biopsy with no tenderness at the site. Post biopsy instructions and care were reviewed and questions were answered. The patient was encouraged to call [facility] for any additional concerns. My direct phone number was provided. The patient is scheduled for a LEFT breast stereotactic guided biopsy on May 26, 2024. Further recommendations will be guided by the results of this biopsy. Pathology results reported by Hendricks Benders, RN on 05/25/2024. Electronically Signed   By: Aliene Lloyd M.D.   On: 05/25/2024 12:09   Result Date: 05/25/2024 CLINICAL DATA:  83 year old woman with indeterminate 0.5 cm LEFT breast mass 11 o'clock 5 CMFN presents for ultrasound-guided core needle biopsy. EXAM: ULTRASOUND GUIDED LEFT BREAST CORE NEEDLE BIOPSY COMPARISON:  Previous exam(s). PROCEDURE: I met with the patient and we discussed the procedure of ultrasound-guided biopsy, including benefits and alternatives. We discussed the high likelihood of a successful procedure. We discussed the risks of the procedure, including infection, bleeding, tissue injury, clip migration, and inadequate sampling. Informed written consent was given. The usual time-out protocol was performed immediately prior to the procedure. Lesion quadrant: Upper inner quadrant Using sterile technique and 1% Lidocaine  as local anesthetic, under direct ultrasound visualization, a 14 gauge spring-loaded device was used to perform biopsy of LEFT breast mass 11 o'clock 5 CMFN using a lateral approach. At the conclusion of the procedure ribbon shaped tissue marker clip was deployed into the biopsy cavity. Follow up 2 view mammogram was performed and dictated separately. IMPRESSION: Ultrasound guided biopsy of 0.5 cm LEFT breast mass 11 o'clock 5 CMFN. No apparent complications. Electronically Signed: By: Aliene Lloyd M.D. On: 05/24/2024 16:39   MM CLIP PLACEMENT LEFT Result Date: 05/24/2024 CLINICAL DATA:  Status  post ultrasound-guided core needle biopsy of LEFT breast mass EXAM: 3D DIAGNOSTIC LEFT MAMMOGRAM POST ULTRASOUND BIOPSY COMPARISON:  Previous exam(s). ACR Breast Density Category c: The breasts are heterogeneously dense, which may obscure small masses. FINDINGS: 3D Mammographic images were obtained following ultrasound guided biopsy of 0.5 cm LEFT breast mass at 11 o'clock 5 CMFN. The biopsy marking clip is in  expected position at the site of biopsy. IMPRESSION: 1. Appropriate positioning of the ribbon shaped biopsy marking clip at the site of biopsy in the upper inner LEFT breast. 2. Please note that this biopsied mass does not correspond with the mammographic mass seen on diagnostic workup in the LEFT breast at middle depth. The patient will be scheduled for stereotactic guided biopsy of this mammographic mass. Final Assessment: Post Procedure Mammograms for Marker Placement Electronically Signed   By: Aliene Lloyd M.D.   On: 05/24/2024 16:42   Assessment & Plan Ductal carcinoma in situ of left breast, G1, ER+/PR+ DCIS of the left breast, stage 0, grade 1, identified on mammogram and confirmed by biopsy. No palpable lump, pain, or nipple discharge.  - She is scheduled for left breast lumpectomy on July 13, 2024 by Dr. Curvin.  - Her DCIS will be cured, any adjuvant therapy is to reduce her risk of future breast cancer.  - She is considering radiation therapy post-surgery, which may negate the need for tamoxifen, typically taken for three years.  I reviewed the benefit and potential side effects.  Due to her advanced age, I do not feel she needs both radiation or tamoxifen.  Patient prefers radiation therapy given the short duration. - Continue annual mammograms.  Due to her dense breast tissue, I recommend conscious induced mammogram.  Osteoarthritis Chronic back and knee pain attributed to osteoarthritis. She uses a cane as needed for mobility.  Peripheral neuropathy Bilateral foot neuropathy of  unknown etiology. She takes B12 supplements.  Essential hypertension  Hyperlipidemia  Gastroesophageal reflux disease (GERD)  PLAN - Imaging and biopsy results reviewed with patient in detail. -She will proceed with left breast lumpectomy on July 23 - Patient prefers adjuvant radiation over tamoxifen, and will see Dr. Izell back after surgery. - I will see her as needed.     No orders of the defined types were placed in this encounter.   All questions were answered. The patient knows to call the clinic with any problems, questions or concerns. I spent 30 minutes counseling the patient face to face. The total time spent in the appointment was 35 minutes including review of chart and various tests results, discussions about plan of care and coordination of care plan.     Onita Mattock, MD 06/14/2024 4:50 PM

## 2024-06-15 ENCOUNTER — Telehealth: Payer: Self-pay

## 2024-06-15 NOTE — Telephone Encounter (Signed)
 Pt called stating she would like to speak with Dr. Lanny regarding her decision on her tx plan.  Pt stated she's changed her mind and would rather do medication rather than do radiation.  Pt stated she would like to speak with Dr. Lanny regarding the change in her tx plan.  Notified Dr. Lanny of the pt's call and request to speak with her.

## 2024-06-15 NOTE — Progress Notes (Signed)
 [facility] Quality Team Note  Name: Katrina Lucas Date of Birth: [birth year] MRN: [id number] Date: 06/15/2024  [facility] Quality Team has reviewed this patient's chart, please see recommendations below:  [facility] Quality Other; (CHART REVIEWED FOR CONTROLLING BLOOD PRESSURE. ABSTRACTED)

## 2024-06-16 ENCOUNTER — Encounter: Payer: Self-pay | Admitting: *Deleted

## 2024-06-21 ENCOUNTER — Other Ambulatory Visit (HOSPITAL_COMMUNITY): Payer: Self-pay

## 2024-06-21 ENCOUNTER — Other Ambulatory Visit: Payer: Self-pay | Admitting: Family Medicine

## 2024-06-21 ENCOUNTER — Telehealth: Payer: Self-pay

## 2024-06-21 DIAGNOSIS — M545 Low back pain, unspecified: Secondary | ICD-10-CM

## 2024-06-21 NOTE — Telephone Encounter (Signed)
 PLEASE BE ADVISED Clinical questions have been answered and PA submitted.TO PLAN. PA currently Pending.

## 2024-06-21 NOTE — Telephone Encounter (Signed)
 Pharmacy Patient Advocate Encounter   Received notification from CoverMyMeds that prior authorization for traMADol  HCl 50MG  tablets  is required/requested.   Insurance verification completed.   The patient is insured through [facility] .   Per test claim: PA required; PA started via CoverMyMeds. KEY [id number] . Waiting for clinical questions to populate.

## 2024-06-22 NOTE — Telephone Encounter (Signed)
 Pharmacy Patient Advocate Encounter  Received notification from [employer] [facility] that Prior Authorization for traMADol  HCl 50MG  tablets has been APPROVED from 06/21/2024 to 12/22/2024  SEE OUTCOME BELOW  Your PA request has been approved. Additional information will be provided in the approval communication. (Message 1145) Effective Date: 06/21/2024 Authorization Expiration Date: 12/22/2024   PA #/Case ID/Reference #: [id number]

## 2024-07-06 ENCOUNTER — Encounter (HOSPITAL_BASED_OUTPATIENT_CLINIC_OR_DEPARTMENT_OTHER): Payer: Self-pay | Admitting: General Surgery

## 2024-07-06 ENCOUNTER — Encounter (HOSPITAL_BASED_OUTPATIENT_CLINIC_OR_DEPARTMENT_OTHER)
Admission: RE | Admit: 2024-07-06 | Discharge: 2024-07-06 | Disposition: A | Discharge status: Home / Self Care | Source: Ambulatory Visit | Attending: General Surgery | Admitting: General Surgery

## 2024-07-06 DIAGNOSIS — Z01818 Encounter for other preprocedural examination: Secondary | ICD-10-CM | POA: Diagnosis not present

## 2024-07-06 LAB — BASIC METABOLIC PANEL WITH GFR
Anion gap: 9 (ref 5–15)
BUN: 24 mg/dL — ABNORMAL HIGH (ref 8–23)
CO2: 28 mmol/L (ref 22–32)
Calcium: 10.3 mg/dL (ref 8.9–10.3)
Chloride: 100 mmol/L (ref 98–111)
Creatinine, Ser: 0.84 mg/dL (ref 0.44–1.00)
GFR, Estimated: >60 mL/min (ref >60)
Glucose, Bld: 94 mg/dL (ref 70–99)
Potassium: 3.7 mmol/L (ref 3.5–5.1)
Sodium: 137 mmol/L (ref 135–145)

## 2024-07-06 NOTE — Progress Notes (Signed)

## 2024-07-12 ENCOUNTER — Ambulatory Visit

## 2024-07-12 ENCOUNTER — Ambulatory Visit
Admission: RE | Admit: 2024-07-12 | Discharge: 2024-07-12 | Disposition: A | Discharge status: Home / Self Care | Source: Ambulatory Visit | Attending: General Surgery | Admitting: General Surgery

## 2024-07-12 DIAGNOSIS — D0512 Intraductal carcinoma in situ of left breast: Secondary | ICD-10-CM

## 2024-07-12 HISTORY — PX: BREAST BIOPSY: SHX20

## 2024-07-13 ENCOUNTER — Ambulatory Visit (HOSPITAL_BASED_OUTPATIENT_CLINIC_OR_DEPARTMENT_OTHER)
Admission: RE | Admit: 2024-07-13 | Discharge: 2024-07-13 | Disposition: A | Discharge status: Home / Self Care | Attending: General Surgery | Admitting: General Surgery

## 2024-07-13 ENCOUNTER — Other Ambulatory Visit: Payer: Self-pay

## 2024-07-13 ENCOUNTER — Ambulatory Visit (HOSPITAL_BASED_OUTPATIENT_CLINIC_OR_DEPARTMENT_OTHER): Admitting: Anesthesiology

## 2024-07-13 ENCOUNTER — Encounter (HOSPITAL_BASED_OUTPATIENT_CLINIC_OR_DEPARTMENT_OTHER)
Admission: RE | Disposition: A | Discharge status: Home / Self Care | Payer: Self-pay | Source: Home / Self Care | Attending: General Surgery

## 2024-07-13 ENCOUNTER — Encounter (HOSPITAL_BASED_OUTPATIENT_CLINIC_OR_DEPARTMENT_OTHER): Payer: Self-pay | Admitting: General Surgery

## 2024-07-13 ENCOUNTER — Ambulatory Visit
Admission: RE | Admit: 2024-07-13 | Discharge: 2024-07-13 | Disposition: A | Discharge status: Home / Self Care | Source: Ambulatory Visit | Attending: General Surgery | Admitting: General Surgery

## 2024-07-13 DIAGNOSIS — D0512 Intraductal carcinoma in situ of left breast: Secondary | ICD-10-CM

## 2024-07-13 DIAGNOSIS — K219 Gastro-esophageal reflux disease without esophagitis: Secondary | ICD-10-CM | POA: Insufficient documentation

## 2024-07-13 DIAGNOSIS — I10 Essential (primary) hypertension: Secondary | ICD-10-CM | POA: Insufficient documentation

## 2024-07-13 DIAGNOSIS — N641 Fat necrosis of breast: Secondary | ICD-10-CM | POA: Diagnosis not present

## 2024-07-13 DIAGNOSIS — Z01818 Encounter for other preprocedural examination: Secondary | ICD-10-CM

## 2024-07-13 DIAGNOSIS — L905 Scar conditions and fibrosis of skin: Secondary | ICD-10-CM | POA: Diagnosis not present

## 2024-07-13 HISTORY — PX: BREAST LUMPECTOMY WITH RADIOACTIVE SEED LOCALIZATION: SHX6424

## 2024-07-13 SURGERY — BREAST LUMPECTOMY WITH RADIOACTIVE SEED LOCALIZATION
Anesthesia: General | Site: Breast | Laterality: Left

## 2024-07-13 MED ORDER — TRAMADOL HCL 50 MG PO TABS: 50.0000 mg | ORAL_TABLET | Freq: Four times a day (QID) | ORAL | 0 refills | Status: DC | PRN

## 2024-07-13 MED ORDER — GABAPENTIN 100 MG PO CAPS
ORAL_CAPSULE | ORAL | Status: AC
  Filled 2024-07-13: qty 1

## 2024-07-13 MED ORDER — CHLORHEXIDINE GLUCONATE CLOTH 2 % EX PADS: 6.0000 | MEDICATED_PAD | Freq: Once | CUTANEOUS | Status: DC

## 2024-07-13 MED ORDER — GABAPENTIN 100 MG PO CAPS
100.0000 mg | ORAL_CAPSULE | ORAL | Status: AC
  Administered 2024-07-13: 100 mg via ORAL

## 2024-07-13 MED ORDER — AMISULPRIDE (ANTIEMETIC) 5 MG/2ML IV SOLN: 10.0000 mg | Freq: Once | INTRAVENOUS | Status: DC | PRN

## 2024-07-13 MED ORDER — LIDOCAINE 2% (20 MG/ML) 5 ML SYRINGE
INTRAMUSCULAR | Status: AC
  Filled 2024-07-13: qty 5

## 2024-07-13 MED ORDER — CEFAZOLIN SODIUM-DEXTROSE 2-4 GM/100ML-% IV SOLN
2.0000 g | INTRAVENOUS | Status: AC
  Administered 2024-07-13: 2 g via INTRAVENOUS

## 2024-07-13 MED ORDER — GLYCOPYRROLATE PF 0.2 MG/ML IJ SOSY
PREFILLED_SYRINGE | INTRAMUSCULAR | Status: DC | PRN
  Administered 2024-07-13: .2 mg via INTRAVENOUS

## 2024-07-13 MED ORDER — OXYCODONE HCL 5 MG/5ML PO SOLN: 5.0000 mg | Freq: Once | ORAL | Status: DC | PRN

## 2024-07-13 MED ORDER — SODIUM CHLORIDE 0.9 % IV SOLN: 12.5000 mg | INTRAVENOUS | Status: DC | PRN

## 2024-07-13 MED ORDER — DEXAMETHASONE SODIUM PHOSPHATE 10 MG/ML IJ SOLN
INTRAMUSCULAR | Status: DC | PRN
  Administered 2024-07-13: 10 mg via INTRAVENOUS

## 2024-07-13 MED ORDER — PHENYLEPHRINE 80 MCG/ML (10ML) SYRINGE FOR IV PUSH (FOR BLOOD PRESSURE SUPPORT)
PREFILLED_SYRINGE | INTRAVENOUS | Status: DC | PRN
  Administered 2024-07-13 (×3): 80 ug via INTRAVENOUS
  Administered 2024-07-13: 40 ug via INTRAVENOUS
  Administered 2024-07-13: 160 ug via INTRAVENOUS
  Administered 2024-07-13: 80 ug via INTRAVENOUS

## 2024-07-13 MED ORDER — LIDOCAINE 2% (20 MG/ML) 5 ML SYRINGE
INTRAMUSCULAR | Status: DC | PRN
  Administered 2024-07-13: 40 mg via INTRAVENOUS

## 2024-07-13 MED ORDER — 0.9 % SODIUM CHLORIDE (POUR BTL) OPTIME
TOPICAL | Status: DC | PRN
  Administered 2024-07-13: 1000 mL

## 2024-07-13 MED ORDER — DEXMEDETOMIDINE HCL IN NACL 80 MCG/20ML IV SOLN
INTRAVENOUS | Status: DC | PRN
  Administered 2024-07-13: 6 ug via INTRAVENOUS

## 2024-07-13 MED ORDER — LACTATED RINGERS IV SOLN: INTRAVENOUS | Status: DC

## 2024-07-13 MED ORDER — EPHEDRINE SULFATE-NACL 50-0.9 MG/10ML-% IV SOSY
PREFILLED_SYRINGE | INTRAVENOUS | Status: DC | PRN
  Administered 2024-07-13 (×4): 5 mg via INTRAVENOUS

## 2024-07-13 MED ORDER — ONDANSETRON HCL 4 MG/2ML IJ SOLN
INTRAMUSCULAR | Status: DC | PRN
  Administered 2024-07-13: 4 mg via INTRAVENOUS

## 2024-07-13 MED ORDER — FENTANYL CITRATE (PF) 100 MCG/2ML IJ SOLN
INTRAMUSCULAR | Status: DC | PRN
  Administered 2024-07-13 (×2): 50 ug via INTRAVENOUS

## 2024-07-13 MED ORDER — CEFAZOLIN SODIUM-DEXTROSE 2-4 GM/100ML-% IV SOLN
INTRAVENOUS | Status: AC
  Filled 2024-07-13: qty 100

## 2024-07-13 MED ORDER — GLYCOPYRROLATE PF 0.2 MG/ML IJ SOSY
PREFILLED_SYRINGE | INTRAMUSCULAR | Status: AC
  Filled 2024-07-13: qty 1

## 2024-07-13 MED ORDER — EPHEDRINE 5 MG/ML INJ
INTRAVENOUS | Status: AC
Start: 2024-07-13 — End: 2024-07-13
  Filled 2024-07-13: qty 5

## 2024-07-13 MED ORDER — OXYCODONE HCL 5 MG PO TABS
ORAL_TABLET | ORAL | Status: AC
  Filled 2024-07-13: qty 1

## 2024-07-13 MED ORDER — ONDANSETRON HCL 4 MG/2ML IJ SOLN
INTRAMUSCULAR | Status: AC
  Filled 2024-07-13: qty 2

## 2024-07-13 MED ORDER — OXYCODONE HCL 5 MG PO TABS: 5.0000 mg | ORAL_TABLET | Freq: Once | ORAL | Status: DC | PRN

## 2024-07-13 MED ORDER — LACTATED RINGERS IV SOLN: INTRAVENOUS | Status: DC | PRN

## 2024-07-13 MED ORDER — BUPIVACAINE-EPINEPHRINE (PF) 0.25% -1:200000 IJ SOLN
INTRAMUSCULAR | Status: DC | PRN
  Administered 2024-07-13: 20 mL

## 2024-07-13 MED ORDER — ACETAMINOPHEN 500 MG PO TABS
1000.0000 mg | ORAL_TABLET | ORAL | Status: AC
  Administered 2024-07-13: 1000 mg via ORAL

## 2024-07-13 MED ORDER — ACETAMINOPHEN 500 MG PO TABS
ORAL_TABLET | ORAL | Status: AC
  Filled 2024-07-13: qty 2

## 2024-07-13 MED ORDER — PROPOFOL 10 MG/ML IV BOLUS
INTRAVENOUS | Status: DC | PRN
  Administered 2024-07-13: 50 mg via INTRAVENOUS
  Administered 2024-07-13: 150 mg via INTRAVENOUS

## 2024-07-13 MED ORDER — FENTANYL CITRATE (PF) 100 MCG/2ML IJ SOLN
INTRAMUSCULAR | Status: AC
  Filled 2024-07-13: qty 2

## 2024-07-13 MED ORDER — HYDROMORPHONE HCL 1 MG/ML IJ SOLN: 0.2500 mg | INTRAMUSCULAR | Status: DC | PRN

## 2024-07-13 MED ORDER — DEXAMETHASONE SODIUM PHOSPHATE 10 MG/ML IJ SOLN
INTRAMUSCULAR | Status: AC
  Filled 2024-07-13: qty 1

## 2024-07-13 MED ORDER — PHENYLEPHRINE 80 MCG/ML (10ML) SYRINGE FOR IV PUSH (FOR BLOOD PRESSURE SUPPORT)
PREFILLED_SYRINGE | INTRAVENOUS | Status: AC
  Filled 2024-07-13: qty 10

## 2024-07-13 SURGICAL SUPPLY — 32 items
BLADE SURG 15 STRL LF DISP TIS (BLADE) ×1 IMPLANT
CANISTER SUC SOCK COL 7IN (MISCELLANEOUS) ×1 IMPLANT
CANISTER SUCT 1200ML W/VALVE (MISCELLANEOUS) ×1 IMPLANT
CHLORAPREP W/TINT 26 (MISCELLANEOUS) ×1 IMPLANT
CLIP APPLIE 9.375 MED OPEN (MISCELLANEOUS) IMPLANT
COVER BACK TABLE 60X90IN (DRAPES) ×1 IMPLANT
COVER MAYO STAND STRL (DRAPES) ×1 IMPLANT
COVER PROBE CYLINDRICAL 5X96 (MISCELLANEOUS) ×1 IMPLANT
DERMABOND ADVANCED .7 DNX12 (GAUZE/BANDAGES/DRESSINGS) ×1 IMPLANT
DRAPE LAPAROSCOPIC ABDOMINAL (DRAPES) ×1 IMPLANT
DRAPE UTILITY XL STRL (DRAPES) ×1 IMPLANT
ELECT COATED BLADE 2.86 ST (ELECTRODE) ×1 IMPLANT
ELECTRODE REM PT RTRN 9FT ADLT (ELECTROSURGICAL) ×1 IMPLANT
GLOVE BIO SURGEON STRL SZ7.5 (GLOVE) ×2 IMPLANT
GOWN STRL REUS W/ TWL LRG LVL3 (GOWN DISPOSABLE) ×2 IMPLANT
KIT MARKER MARGIN INK (KITS) ×1 IMPLANT
NDL HYPO 25X1 1.5 SAFETY (NEEDLE) IMPLANT
NEEDLE HYPO 25X1 1.5 SAFETY (NEEDLE) ×1 IMPLANT
NS IRRIG 1000ML POUR BTL (IV SOLUTION) IMPLANT
PACK BASIN DAY SURGERY FS (CUSTOM PROCEDURE TRAY) ×1 IMPLANT
PENCIL SMOKE EVACUATOR (MISCELLANEOUS) ×1 IMPLANT
SLEEVE SCD COMPRESS KNEE MED (STOCKING) ×1 IMPLANT
SPIKE FLUID TRANSFER (MISCELLANEOUS) IMPLANT
SPONGE T-LAP 18X18 ~~LOC~~+RFID (SPONGE) ×1 IMPLANT
SUT MON AB 4-0 PC3 18 (SUTURE) ×1 IMPLANT
SUT SILK 2 0 SH (SUTURE) IMPLANT
SUT VICRYL 3-0 CR8 SH (SUTURE) ×1 IMPLANT
SYR CONTROL 10ML LL (SYRINGE) IMPLANT
TOWEL GREEN STERILE FF (TOWEL DISPOSABLE) ×1 IMPLANT
TRAY FAXITRON CT DISP (TRAY / TRAY PROCEDURE) ×1 IMPLANT
TUBE CONNECTING 20X1/4 (TUBING) ×1 IMPLANT
YANKAUER SUCT BULB TIP NO VENT (SUCTIONS) IMPLANT

## 2024-07-13 NOTE — Anesthesia Procedure Notes (Signed)
 Procedure Name: LMA Insertion Date/Time: 07/13/2024 10:29 AM  Performed by: Denton Niels CROME, CRNAPre-anesthesia Checklist: Patient identified, Emergency Drugs available, Suction available, Patient being monitored and Timeout performed Patient Re-evaluated:Patient Re-evaluated prior to induction Oxygen Delivery Method: Circle system utilized Preoxygenation: Pre-oxygenation with 100% oxygen Induction Type: IV induction Ventilation: Mask ventilation without difficulty LMA: LMA inserted LMA Size: 4.0 Number of attempts: 2 Placement Confirmation: positive ETCO2 Tube secured with: Tape Dental Injury: Teeth and Oropharynx as per pre-operative assessment

## 2024-07-13 NOTE — Discharge Instructions (Addendum)
  Post Anesthesia Home Care Instructions  Activity: Get plenty of rest for the remainder of the day. A responsible individual must stay with you for 24 hours following the procedure.  For the next 24 hours, DO NOT: -Drive a car -Advertising copywriter -Drink alcoholic beverages -Take any medication unless instructed by your physician -Make any legal decisions or sign important papers.  Meals: Start with liquid foods such as gelatin or soup. Progress to regular foods as tolerated. Avoid greasy, spicy, heavy foods. If nausea and/or vomiting occur, drink only clear liquids until the nausea and/or vomiting subsides. Call your physician if vomiting continues.  Special Instructions/Symptoms: Your throat may feel dry or sore from the anesthesia or the breathing tube placed in your throat during surgery. If this causes discomfort, gargle with warm salt water. The discomfort should disappear within 24 hours.  Tylenol  can be taken at 4 pm if needed

## 2024-07-13 NOTE — Op Note (Signed)
 07/13/2024  11:13 AM  PATIENT:  Katrina Lucas  83 y.o. female  PRE-OPERATIVE DIAGNOSIS:  LEFT BREAST DCIS  POST-OPERATIVE DIAGNOSIS:  LEFT BREAST DCIS  PROCEDURE:  Procedure(s) with comments: LEFT BREAST RADIOACTIVE SEED LOCALIZED LUMPECTOMY  SURGEON:  Surgeons and Role:    * Curvin Deward MOULD, MD - Primary  PHYSICIAN ASSISTANT:   ASSISTANTS: none   ANESTHESIA:   local and general  EBL:  5 mL   BLOOD ADMINISTERED:none  DRAINS: none   LOCAL MEDICATIONS USED:  MARCAINE      SPECIMEN:  Source of Specimen:  left breast tissue with additional deep, inferior and superior margins  DISPOSITION OF SPECIMEN:  PATHOLOGY  COUNTS:  YES  TOURNIQUET:  * No tourniquets in log *  DICTATION: .Dragon Dictation  After informed consent was obtained the patient was brought to the operating room and placed in the supine position on the operating table.  After adequate induction of general anesthesia the patient's left breast was prepped with ChloraPrep, allowed to dry, and draped in usual sterile manner.  An appropriate timeout was performed.  Previously an I-125 seed was placed in the upper portion of the left breast to mark an area of ductal carcinoma in situ.  The neoprobe was set to I-125 in the area of radioactivity was readily identified.  The area around this was infiltrated with quarter percent Marcaine .  A curvilinear incision was made with a 15 blade knife along the upper edge of the areola of the left breast.  The incision was carried through the skin and subcutaneous tissue sharply with the electrocautery.  The dissection was then carried superiorly between the breast tissue and the subcutaneous fat and skin.  Once this dissection was beyond the area of the cancer I then removed a circular portion of breast tissue sharply with the electrocautery around the radioactive seed while checking the area of radioactivity frequently.  Once the tissue was removed it was oriented with the  appropriate paint colors.  A specimen radiograph was obtained that showed the clip and seed to be near the deep edge of the specimen.  The specimen was then sent to pathology for further evaluation.  I elected to take an additional deep margin down to the muscle of the chest wall as well as an additional superior and inferior margin.  These were all marked appropriately and sent to pathology for further evaluation.  Hemostasis was achieved using the Bovie electrocautery.  The wound was irrigated with saline and infiltrated with more quarter percent Marcaine .  The cavity was marked with clips.  The deep layer of the incision was then closed with layers of interrupted 3-0 Vicryl stitches.  The skin was then closed with interrupted 4-0 Monocryl subcuticular stitches.  Dermabond dressings were applied.  The patient tolerated the procedure well.  At the end of the case all needle sponge and instrument counts were correct.  The patient was then awakened and taken to recovery in stable condition.  PLAN OF CARE: Discharge to home after PACU  PATIENT DISPOSITION:  PACU - hemodynamically stable.   Delay start of Pharmacological VTE agent (>24hrs) due to surgical blood loss or risk of bleeding: not applicable

## 2024-07-13 NOTE — H&P (Signed)
 REFERRING PHYSICIAN: Copland, Harlene Solan* PROVIDER: DEWARD GARNETTE NULL, MD MRN: [id number] DOB: 07-01-1941 Subjective   Chief Complaint: New Consultation (Lt breast cancer)  History of Present Illness: Katrina Lucas is a 83 y.o. female who is seen today as an office consultation for evaluation of New Consultation (Lt breast cancer)  We are asked to see the patient in consultation by Dr. Watt to evaluate her for a new left breast cancer. The patient is a 83 year old white female who recently went for a 53-month follow-up mammogram. At that time she was found to have a 5 mm mass in the upper inner quadrant of the left breast. 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 She has no family history of breast cancer. She does have siblings that have had Hodgkin's disease and bladder cancer and thyroid  cancer. She has some hypertension and arthritis but does not smoke.  Review of Systems: A complete review of systems was obtained from the patient. I have reviewed this information and discussed as appropriate with the patient. See HPI as well for other ROS.  ROS   Medical History: Past Medical History:  Diagnosis Date  Arthritis  GERD (gastroesophageal reflux disease)  History of cancer  Hyperlipidemia  Hypertension   Patient Active Problem List  Diagnosis  Ductal carcinoma in situ (DCIS) of left breast   History reviewed. No pertinent surgical history.   Allergies  Allergen Reactions  Sulfa (Sulfonamide Antibiotics) Shortness Of Breath  Aspirin Other (See Comments)  Microscopic bleeding  Microscopic bleeding Microscopic bleeding Microscopic bleeding Microscopic bleeding  Microscopic bleeding Microscopic bleeding  Codeine Other (See Comments) and Nausea And Vomiting   Current Outpatient Medications on File Prior to Visit  Medication Sig Dispense Refill  acetaminophen  (TYLENOL ) 325 MG tablet Take 650 mg by mouth   alendronate  (FOSAMAX ) 70 MG tablet TAKE 1 TABLET EVERY 7 DAYS WITH A FULL GLASS OF WATER ON AN EMPTY STOMACH  amLODIPine  (NORVASC ) 5 MG tablet Take 5 mg by mouth once daily  Bifidobacterium longum (ALIGN) 10 million cell capsule Take 1 capsule by mouth once daily  cetirizine (ZYRTEC) 10 MG tablet Take 10 mg by mouth once daily  cyanocobalamin (VITAMIN B12) 1000 MCG tablet Take by mouth  LORazepam  (ATIVAN ) 0.5 MG tablet Take 0.5 mg by mouth  losartan -hydroCHLOROthiazide (HYZAAR) 100-25 mg tablet Take 1 tablet by mouth once daily  LYCOPENE ORAL Take 1 tablet by mouth once daily  rosuvastatin  (CRESTOR ) 10 MG tablet Take 10 mg by mouth once daily   No current facility-administered medications on file prior to visit.   Family History  Problem Relation Age of Onset  Skin cancer Mother  Coronary Artery Disease (Blocked arteries around heart) Father  Coronary Artery Disease (Blocked arteries around heart) Brother    Social History   Tobacco Use  Smoking Status Never  Smokeless Tobacco Never    Social History   Socioeconomic History  Marital status: Unknown  Tobacco Use  Smoking status: Never  Smokeless tobacco: Never  Vaping Use  Vaping status: Never Used  Substance and Sexual Activity  Alcohol use: Yes  Alcohol/week: 12.0 - 28.0 standard drinks of alcohol  Types: 12 - 28 Standard drinks or equivalent per week  Drug use: Never   Social Drivers of Corporate investment banker Strain: Low Risk (03/25/2024)  Received from [facility]  Overall Financial Resource Strain (CARDIA)  Difficulty of Paying Living Expenses: Not hard at all  Food  Insecurity: Unknown (03/25/2024)  Received from [facility]  Hunger Vital Sign  Within the past 12 months, you worried that your food would run out before you got the money to buy more.: Never true  Transportation Needs: No Transportation Needs (03/25/2024)  Received from [facility] - Transportation  Lack of Transportation (Medical): No   Lack of Transportation (Non-Medical): No  Physical Activity: Insufficiently Active (03/25/2024)  Received from [facility]  Exercise Vital Sign  On average, how many days per week do you engage in moderate to strenuous exercise (like a brisk walk)?: 2 days  On average, how many minutes do you engage in exercise at this level?: 40 min  Stress: No Stress Concern Present (03/25/2024)  Received from [facility] - Occupational Stress Questionnaire  Feeling of Stress : Not at all  Social Connections: Socially Isolated (03/25/2024)  Received from [facility]  Social Connection and Isolation Panel  In a typical week, how many times do you talk on the phone with family, friends, or neighbors?: Once a week  How often do you get together with friends or relatives?: Once a week  How often do you attend church or religious services?: Never  Do you belong to any clubs or organizations such as church groups, unions, fraternal or athletic groups, or school groups?: No  Are you married, widowed, divorced, separated, never married, or living with a partner?: Never married  Housing Stability: Unknown (06/03/2024)  Housing Stability Vital Sign  Homeless in the Last Year: No   Objective:   Vitals:  BP: 123/67  Pulse: 92  Temp: 36.8 C (98.3 F)  SpO2: 98%  Weight: 72.6 kg (160 lb)  Height: 174 cm (5' 8.5)  PainSc: 0-No pain  PainLoc: Breast   Body mass index is 23.97 kg/m.  Physical Exam Vitals reviewed.  Constitutional:  General: She is not in acute distress. Appearance: Normal appearance.  HENT:  Head: Normocephalic and atraumatic.  Right Ear: External ear normal.  Left Ear: External ear normal.  Nose: Nose normal.  Mouth/Throat:  Mouth: Mucous membranes are moist.  Pharynx: Oropharynx is clear.   Eyes:  General: No scleral icterus. Extraocular Movements: Extraocular movements intact.  Conjunctiva/sclera: Conjunctivae normal.  Pupils: Pupils  are equal, round, and reactive to light.   Cardiovascular:  Rate and Rhythm: Normal rate and regular rhythm.  Pulses: Normal pulses.  Heart sounds: Normal heart sounds.  Pulmonary:  Effort: Pulmonary effort is normal. No respiratory distress.  Breath sounds: Normal breath sounds.  Abdominal:  General: Bowel sounds are normal.  Palpations: Abdomen is soft.  Tenderness: There is no abdominal tenderness.   Musculoskeletal:  General: No swelling, tenderness or deformity. Normal range of motion.  Cervical back: Normal range of motion and neck supple.   Skin: General: Skin is warm and dry.  Coloration: Skin is not jaundiced.   Neurological:  General: No focal deficit present.  Mental Status: She is alert and oriented to person, place, and time.   Psychiatric:  Mood and Affect: Mood normal.  Behavior: Behavior normal.     Breast: There is no palpable mass in either breast. There is no palpable axillary, supraclavicular, or cervical lymphadenopathy.  Labs, Imaging and Diagnostic Testing:  Assessment and Plan:   Diagnoses and all orders for this visit:  Ductal carcinoma in situ (DCIS) of left breast - Ambulatory Referral to Oncology-Medical - Ambulatory Referral to Radiation Oncology - [facility] Case Posting Request; Future   The  patient appears to have a 5 mm area of low-grade DCIS in the upper inner quadrant of the left breast with favorable markers. I have discussed with her in detail the different options for treatment and at this point she favors breast conservation which I feel is very reasonable. She will not need a node evaluation. I have discussed with her in detail the risks and benefits of the operation as well as some of the technical aspects including use of a radioactive seed for localization and she understands and wishes to proceed. I will refer her to medical and radiation oncology to discuss adjuvant therapy. We will move forward with surgical scheduling.

## 2024-07-13 NOTE — Transfer of Care (Signed)
 Immediate Anesthesia Transfer of Care Note  Patient: Katrina Lucas  Procedure(s) Performed: BREAST LUMPECTOMY WITH RADIOACTIVE SEED LOCALIZATION (Left: Breast)  Patient Location: PACU  Anesthesia Type:General  Level of Consciousness: drowsy and responds to stimulation  Airway & Oxygen Therapy: Patient Spontanous Breathing and Patient connected to face mask oxygen  Post-op Assessment: Report given to RN and Post -op Vital signs reviewed and stable  Post vital signs: Reviewed and stable  Last Vitals:  Vitals Value Taken Time  BP 115/57 07/13/24 11:21  Temp    Pulse 81 07/13/24 11:21  Resp 17 07/13/24 11:21  SpO2 99 % 07/13/24 11:21  Vitals shown include unfiled device data.  Last Pain:  Vitals:   07/13/24 0944  TempSrc: Temporal  PainSc: 2          Complications: No notable events documented.

## 2024-07-13 NOTE — Interval H&P Note (Signed)
 History and Physical Interval Note:  07/13/2024 9:47 AM  Katrina Lucas  has presented today for surgery, with the diagnosis of LEFT BREAST DCIS.  The various methods of treatment have been discussed with the patient and family. After consideration of risks, benefits and other options for treatment, the patient has consented to  Procedure(s) with comments: BREAST LUMPECTOMY WITH RADIOACTIVE SEED LOCALIZATION (Left) - LEFT BREAST RADIOACTIVE SEED LOCALIZED LUMPECTOMY as a surgical intervention.  The patient's history has been reviewed, patient examined, no change in status, stable for surgery.  I have reviewed the patient's chart and labs.  Questions were answered to the patient's satisfaction.     Deward Null III

## 2024-07-13 NOTE — Anesthesia Postprocedure Evaluation (Signed)
 Anesthesia Post Note  Patient: Katrina Lucas  Procedure(s) Performed: BREAST LUMPECTOMY WITH RADIOACTIVE SEED LOCALIZATION (Left: Breast)     Patient location during evaluation: PACU Anesthesia Type: General Level of consciousness: awake and alert Pain management: pain level controlled Vital Signs Assessment: post-procedure vital signs reviewed and stable Respiratory status: spontaneous breathing, nonlabored ventilation and respiratory function stable Cardiovascular status: blood pressure returned to baseline and stable Postop Assessment: no apparent nausea or vomiting Anesthetic complications: no   No notable events documented.  Last Vitals:  Vitals:   07/13/24 1157 07/13/24 1202  BP:  (!) 121/58  Pulse: 75   Resp: 18 16  Temp:  (!) 36.4 C  SpO2: 92% 94%    Last Pain:  Vitals:   07/13/24 1202  TempSrc:   PainSc: 1                  Butler Levander Pinal

## 2024-07-13 NOTE — Anesthesia Preprocedure Evaluation (Signed)
 Anesthesia Evaluation  Patient identified by MRN, date of birth, ID band Patient awake    Reviewed: Allergy & Precautions, H&P , NPO status , Patient's Chart, lab work & pertinent test results  Airway Mallampati: II  TM Distance: >3 FB Neck ROM: Full    Dental  (+) Dental Advisory Given   Pulmonary neg pulmonary ROS   Pulmonary exam normal breath sounds clear to auscultation       Cardiovascular hypertension, Pt. on medications negative cardio ROS Normal cardiovascular exam Rhythm:Regular Rate:Normal     Neuro/Psych negative neurological ROS  negative psych ROS   GI/Hepatic Neg liver ROS,GERD  ,,  Endo/Other  negative endocrine ROS    Renal/GU negative Renal ROS  negative genitourinary   Musculoskeletal negative musculoskeletal ROS (+)    Abdominal   Peds negative pediatric ROS (+)  Hematology negative hematology ROS (+)   Anesthesia Other Findings   Reproductive/Obstetrics negative OB ROS                              Anesthesia Physical Anesthesia Plan  ASA: 2  Anesthesia Plan: General   Post-op Pain Management:    Induction: Intravenous  PONV Risk Score and Plan: 3 and Ondansetron , Dexamethasone , Midazolam and Treatment may vary due to age or medical condition  Airway Management Planned: LMA  Additional Equipment:   Intra-op Plan:   Post-operative Plan: Extubation in OR  Informed Consent: I have reviewed the patients History and Physical, chart, labs and discussed the procedure including the risks, benefits and alternatives for the proposed anesthesia with the patient or authorized representative who has indicated his/her understanding and acceptance.     Dental advisory given  Plan Discussed with: CRNA  Anesthesia Plan Comments:         Anesthesia Quick Evaluation

## 2024-07-14 ENCOUNTER — Encounter (HOSPITAL_BASED_OUTPATIENT_CLINIC_OR_DEPARTMENT_OTHER): Payer: Self-pay | Admitting: General Surgery

## 2024-07-15 ENCOUNTER — Ambulatory Visit: Payer: Self-pay | Admitting: General Surgery

## 2024-07-15 LAB — SURGICAL PATHOLOGY

## 2024-07-19 ENCOUNTER — Encounter: Payer: Self-pay | Admitting: *Deleted

## 2024-07-19 DIAGNOSIS — D0512 Intraductal carcinoma in situ of left breast: Secondary | ICD-10-CM

## 2024-07-20 ENCOUNTER — Telehealth: Payer: Self-pay | Admitting: Hematology

## 2024-07-20 NOTE — Telephone Encounter (Signed)
 Scheduled appointments per staff message. Called and left VM with appointment details for the patient.

## 2024-07-26 DIAGNOSIS — K080 Exfoliation of teeth due to systemic causes: Secondary | ICD-10-CM | POA: Diagnosis not present

## 2024-08-04 ENCOUNTER — Encounter (INDEPENDENT_AMBULATORY_CARE_PROVIDER_SITE_OTHER): Payer: Self-pay | Admitting: Family Medicine

## 2024-08-04 DIAGNOSIS — K047 Periapical abscess without sinus: Secondary | ICD-10-CM | POA: Diagnosis not present

## 2024-08-04 MED ORDER — PENICILLIN V POTASSIUM 500 MG PO TABS: 500.0000 mg | ORAL_TABLET | Freq: Three times a day (TID) | ORAL | 0 refills | Status: DC

## 2024-08-04 NOTE — Telephone Encounter (Signed)

## 2024-08-15 ENCOUNTER — Encounter: Payer: Self-pay | Admitting: Hematology

## 2024-08-15 ENCOUNTER — Inpatient Hospital Stay: Attending: Hematology | Admitting: Hematology

## 2024-08-15 VITALS — BP 138/72 | HR 81 | Temp 97.7°F | Resp 17 | Wt 163.8 lb

## 2024-08-15 DIAGNOSIS — M810 Age-related osteoporosis without current pathological fracture: Secondary | ICD-10-CM | POA: Diagnosis not present

## 2024-08-15 DIAGNOSIS — D0512 Intraductal carcinoma in situ of left breast: Secondary | ICD-10-CM | POA: Insufficient documentation

## 2024-08-15 NOTE — Assessment & Plan Note (Signed)
-  G1, ER+/PR+ - Diagnosed in June 2025 -Status postlumpectomy, which showed a low grade 2 DCIS, both ER and PR strongly positive.  She required a second surgery to have clear margins.

## 2024-08-15 NOTE — Progress Notes (Signed)
 [facility]   Telephone:([phone] [phone]   Clinic Follow up Note   Patient Care Team: Copland, [name], MD as PCP - General (Family Medicine) Lanny Callander, MD as Consulting Physician (Hematology and Oncology) Tyree Nanetta SAILOR, RN as Oncology Nurse Navigator Izell Domino, MD as Attending Physician (Radiation Oncology) Curvin Deward MOULD, MD as Consulting Physician (General Surgery)  Date of Service:  08/15/2024  CHIEF COMPLAINT: f/u of DCIS   CURRENT THERAPY:  Cancer surveillance  Assessment & Plan Ductal carcinoma in situ of breast, status post excision Status post excision of ductal carcinoma in situ (DCIS) of left breast with no residual cancer detected. The lesion was precancerous and small, and the excision was successful. She expressed a preference to avoid radiation due to claustrophobia and is open to considering tamoxifen, which may strengthen bones but can cause hot flashes and slow metabolism. However, the decision was made to not pursue any adjuvant treatment at this time due to her advanced age. - Continue annual mammograms, with the next one due in October or November. - Follow up with primary care physician for routine care and mammogram orders.  Osteoporosis Osteoporosis management includes Fosamax  use for six years. Discussed the potential need for a break from Fosamax . She has not been taking calcium  due to previously elevated calcium  levels but can consider vitamin D supplementation to support bone health. - Discuss with primary care physician about discontinuing Fosamax . - Consider vitamin D supplementation.  Plan - I reviewed her surgical path, which showed no malignancy. - Due to her advanced age, I do not recommend any adjuvant therapy. - She will continue annual screening mammogram with her primary care physician. - I will see her as needed in future.  SUMMARY OF ONCOLOGIC HISTORY: Oncology History  Ductal carcinoma in situ  (DCIS) of left breast  05/26/2024 Cancer Staging   Staging form: Breast, AJCC 8th Edition - Pathologic stage from 05/26/2024: Stage Unknown (pTis (DCIS), pNX, cM0, G1, ER+, PR+, HER2: Not Assessed) - Signed by Lanny Callander, MD on 08/15/2024 Stage prefix: Initial diagnosis Histologic grading system: 3 grade system Residual tumor (R): R0   06/14/2024 Initial Diagnosis   Ductal carcinoma in situ (DCIS) of left breast   06/14/2024 Cancer Staging   Staging form: Breast, AJCC 8th Edition - Clinical stage from 06/14/2024: Stage 0 (cTis (DCIS), cN0, cM0, G1, ER+, PR+, HER2: Not Assessed) - Signed by Wyatt Leeroy HERO, PA-C on 06/14/2024 Stage prefix: Initial diagnosis Histologic grading system: 3 grade system      Discussed the use of AI scribe software for clinical note transcription with the patient, who gave verbal consent to proceed.  History of Present Illness Katrina Lucas is an 83 year old female who presents for follow-up after surgery for ductal carcinoma in situ (DCIS).  She underwent surgery for DCIS, with no residual cancer detected post-operatively. The lesion was small and likely excised during the biopsy. She experiences mild sensitivity in the surgical area when wearing clothes but no incisional pain.  Her current medications include amlodipine , losartan , hydroxychloroquine, alendronate , and a cholesterol medication. She has been on alendronate  for six years and is concerned about the duration of use. She has not been taking calcium  and vitamin D supplements recently due to previously elevated calcium  levels.     All other systems were reviewed with the patient and are negative.  MEDICAL HISTORY:  Past Medical History:  Diagnosis Date   Allergy    Arthritis  Chicken pox    Colon polyp    Diverticulitis    GERD (gastroesophageal reflux disease)    Heart murmur    Hyperlipidemia    Hypertension     SURGICAL HISTORY: Past Surgical History:  Procedure Laterality  Date   BREAST BIOPSY Left 05/24/2024   US  LT BREAST BX W LOC DEV 1ST LESION IMG BX SPEC US  GUIDE 05/24/2024 [facility]   BREAST BIOPSY Left 05/26/2024   MM LT BREAST BX W LOC DEV 1ST LESION IMAGE BX SPEC STEREO GUIDE 05/26/2024 [facility]   BREAST BIOPSY  07/12/2024   MM LT RADIOACTIVE SEED LOC MAMMO GUIDE 07/12/2024 [facility]   BREAST LUMPECTOMY WITH RADIOACTIVE SEED LOCALIZATION Left 07/13/2024   Procedure: BREAST LUMPECTOMY WITH RADIOACTIVE SEED LOCALIZATION;  Surgeon: Curvin Deward MOULD, MD;  Location: [facility];  Service: General;  Laterality: Left;  LEFT BREAST RADIOACTIVE SEED LOCALIZED LUMPECTOMY    I have reviewed the social history and family history with the patient and they are unchanged from previous note.  ALLERGIES:  is allergic to sulfa antibiotics, aspirin, and codeine.  MEDICATIONS:  Current Outpatient Medications  Medication Sig Dispense Refill   acetaminophen  (TYLENOL ) 325 MG tablet Take 650 mg by mouth.      alendronate  (FOSAMAX ) 70 MG tablet TAKE 1 TABLET EVERY 7 DAYS WITH A FULL GLASS OF WATER ON AN EMPTY STOMACH 12 tablet 3   amLODipine  (NORVASC ) 5 MG tablet Take 1 tablet (5 mg total) by mouth daily. 90 tablet 3   cetirizine (ZYRTEC) 10 MG tablet Take 10 mg by mouth daily.     LORazepam  (ATIVAN ) 0.5 MG tablet Take 1 tablet (0.5 mg total) by mouth 2 (two) times daily as needed (dizziness). 30 tablet 0   losartan -hydrochlorothiazide (HYZAAR) 100-25 MG tablet Take 1 tablet by mouth daily. 90 tablet 3   Multiple Vitamins-Minerals (CENTRUM SILVER 50+WOMEN PO) Take by mouth.     penicillin  v potassium (VEETID) 500 MG tablet Take 1 tablet (500 mg total) by mouth 3 (three) times daily. 30 tablet 0   rosuvastatin  (CRESTOR ) 10 MG tablet Take 1 tablet (10 mg total) by mouth daily. 90 tablet 3   traMADol  (ULTRAM ) 50 MG tablet Take 0.5-1 tablets (25-50 mg total) by mouth every 12 (twelve) hours as needed. 20 tablet 0   traMADol  (ULTRAM ) 50 MG tablet  Take 1 tablet (50 mg total) by mouth every 6 (six) hours as needed. 10 tablet 0   UNABLE TO FIND Med Name: Align GI one capsule daily     vitamin B-12 (CYANOCOBALAMIN) 1000 MCG tablet Take by mouth.     No current facility-administered medications for this visit.    PHYSICAL EXAMINATION: ECOG PERFORMANCE STATUS: 0 - Asymptomatic  Vitals:   08/15/24 0815  BP: 138/72  Pulse: 81  Resp: 17  Temp: 97.7 F (36.5 C)  SpO2: 98%   Wt Readings from Last 3 Encounters:  08/15/24 163 lb 12.8 oz (74.3 kg)  07/13/24 161 lb 13.1 oz (73.4 kg)  06/14/24 162 lb (73.5 kg)     GENERAL:alert, no distress and comfortable SKIN: skin color, texture, turgor are normal, no rashes or significant lesions EYES: normal, Conjunctiva are pink and non-injected, sclera clear Musculoskeletal:no cyanosis of digits and no clubbing  NEURO: alert & oriented x 3 with fluent speech, no focal motor/sensory deficits  Physical Exam    LABORATORY DATA:  I have reviewed the data as listed    Latest Ref Rng & Units 03/28/2024  9:59 AM 09/28/2023    8:40 AM 03/25/2023    9:13 AM  CBC  WBC 4.0 - 10.5 K/uL 7.1  8.4  8.9   Hemoglobin 12.0 - 15.0 g/dL 86.2  86.0  85.8   Hematocrit 36.0 - 46.0 % 40.7  42.9  41.7   Platelets 150.0 - 400.0 K/uL 286.0  271.0  252.0         Latest Ref Rng & Units 07/06/2024    2:14 PM 03/28/2024    9:59 AM 09/28/2023    8:40 AM  CMP  Glucose 70 - 99 mg/dL 94  92  96   BUN 8 - 23 mg/dL 24  18  20    Creatinine 0.44 - 1.00 mg/dL 9.15  9.20  9.20   Sodium 135 - 145 mmol/L 137  139  138   Potassium 3.5 - 5.1 mmol/L 3.7  3.8  4.1   Chloride 98 - 111 mmol/L 100  103  102   CO2 22 - 32 mmol/L 28  28  29    Calcium  8.9 - 10.3 mg/dL 89.6  89.8  89.7   Total Protein 6.0 - 8.3 g/dL   7.0   Total Bilirubin 0.2 - 1.2 mg/dL   0.6   Alkaline Phos 39 - 117 U/L   41   AST 0 - 37 U/L   18   ALT 0 - 35 U/L   14       RADIOGRAPHIC STUDIES: I have personally reviewed the radiological images as  listed and agreed with the findings in the report. No results found.    No orders of the defined types were placed in this encounter.  All questions were answered. The patient knows to call the clinic with any problems, questions or concerns. No barriers to learning was detected. The total time spent in the appointment was 15 minutes, including review of chart and various tests results, discussions about plan of care and coordination of care plan     Onita Mattock, MD 08/15/2024

## 2024-08-26 ENCOUNTER — Telehealth: Payer: Self-pay | Admitting: Nurse Practitioner

## 2024-08-26 NOTE — Telephone Encounter (Signed)
 Scheduled patient for Survivorship in November. Called and left voicemail with appointment details.

## 2024-08-31 DIAGNOSIS — K080 Exfoliation of teeth due to systemic causes: Secondary | ICD-10-CM | POA: Diagnosis not present

## 2024-09-01 ENCOUNTER — Telehealth: Payer: Self-pay | Admitting: *Deleted

## 2024-09-01 ENCOUNTER — Encounter: Payer: Self-pay | Admitting: Family Medicine

## 2024-09-01 ENCOUNTER — Ambulatory Visit (INDEPENDENT_AMBULATORY_CARE_PROVIDER_SITE_OTHER): Admitting: *Deleted

## 2024-09-01 VITALS — BP 124/63 | HR 70 | Temp 97.8°F | Resp 14 | Ht 70.0 in | Wt 163.8 lb

## 2024-09-01 DIAGNOSIS — Z0000 Encounter for general adult medical examination without abnormal findings: Secondary | ICD-10-CM | POA: Diagnosis not present

## 2024-09-01 DIAGNOSIS — M810 Age-related osteoporosis without current pathological fracture: Secondary | ICD-10-CM

## 2024-09-01 NOTE — Patient Instructions (Signed)
 Katrina Lucas , Thank you for taking time out of your busy schedule to complete your Annual Wellness Visit with me. I enjoyed our conversation and look forward to speaking with you again next year. I, as well as your care team,  appreciate your ongoing commitment to your health goals. Please review the following plan we discussed and let me know if I can assist you in the future. Your Game plan/ To Do List    Referrals: If you haven't heard from the office you've been referred to, please reach out to them at the phone provided.   Bone Density ([facility]): [phone] Colonoscopy (Dr Marvis):  due June 2026  Follow up Visits: Next Medicare AWV with our clinical staff:  09/06/25 1pm  Next Office Visit with your provider: 09/26/24 8:20, Dr Watt  Clinician Recommendations:  Aim for 30 minutes of exercise or brisk walking, 6-8 glasses of water, and 5 servings of fruits and vegetables each day.       This is a list of the screening recommended for you and due dates:  Health Maintenance  Topic Date Due   Medicare Annual Wellness Visit  02/18/2023   Flu Shot  07/22/2024   COVID-19 Vaccine (10 - Pfizer risk 2024-25 season) 09/27/2024   DTaP/Tdap/Td vaccine (3 - Td or Tdap) 03/12/2031   Pneumococcal Vaccine for age over 66  Completed   DEXA scan (bone density measurement)  Completed   Zoster (Shingles) Vaccine  Completed   HPV Vaccine  Aged Out   Meningitis B Vaccine  Aged Out    Advanced directives: (Copy Requested) Please bring a copy of your health care power of attorney and living will to the office to be added to your chart at your convenience. You can mail to [facility] [address] [place], [place] [address] or email to [email or web address] Advance Care Planning is important because it:  [x]  Makes sure you receive the medical care that is consistent with your values, goals, and preferences  [x]  It provides guidance to your family and loved ones  and reduces their decisional burden about whether or not they are making the right decisions based on your wishes.  Follow the link provided in your after visit summary or read over the paperwork we have mailed to you to help you started getting your Advance Directives in place. If you need assistance in completing these, please reach out to us  so that we can help you!  See attachments for Preventive Care and Fall Prevention Tips.

## 2024-09-01 NOTE — Telephone Encounter (Signed)
 Pt states she has stopped Fosamax  and requests removal from medication list.  She has an abscessed tooth that is going to be worked on next week.  States PCP was going to change the medication anyway.  Please advise if new med needs to be sent in or discuss at upcoming OV on 09/26/24?

## 2024-09-01 NOTE — Progress Notes (Signed)
 Please attest this visit in the absence of patient primary care provider.    Subjective:   Katrina Lucas is a 83 y.o. who presents for a Medicare Wellness preventive visit.  As a reminder, Annual Wellness Visits don't include a physical exam, and some assessments may be limited, especially if this visit is performed virtually. We may recommend an in-person follow-up visit with your provider if needed.  Visit Complete: In person  Persons Participating in Visit: Patient.  AWV Questionnaire: Yes: Patient Medicare AWV questionnaire was completed by the patient on 08/25/24; I have confirmed that all information answered by patient is correct and no changes since this date.  Cardiac Risk Factors include: advanced age (>50men, >32 women);hypertension;dyslipidemia;Other (see comment), Risk factor comments: breast cancer (excised), no further treatment per pt     Objective:    Today's Vitals   08/25/24 1932 09/01/24 1345  BP:  124/63  Pulse:  70  Resp:  14  Temp:  97.8 F (36.6 C)  TempSrc:  Oral  SpO2:  100%  Weight:  163 lb 12.8 oz (74.3 kg)  Height:  5' 10 (1.778 m)  PainSc: 0-No pain    Body mass index is 23.5 kg/m.     09/01/2024    2:05 PM 08/15/2024    8:42 AM 07/13/2024    9:40 AM 06/14/2024   10:09 AM 02/18/2022    2:41 PM 07/17/2021    9:20 AM 03/26/2021    8:07 AM  Advanced Directives  Does Patient Have a Medical Advance Directive? Yes No No Yes Yes No Yes  Type of Estate agent of [place];Living will   Living will Living will  Healthcare Power of [place];Living will  Does patient want to make changes to medical advance directive? No - Patient declined      No - Patient declined  Copy of Healthcare Power of Attorney in Chart? No - copy requested      No - copy requested  Would patient like information on creating a medical advance directive? No - Patient declined No - Patient declined No - Patient declined   No - Patient declined      Current Medications (verified) Outpatient Encounter Medications as of 09/01/2024  Medication Sig   acetaminophen  (TYLENOL ) 325 MG tablet Take 650 mg by mouth.    amLODipine  (NORVASC ) 5 MG tablet Take 1 tablet (5 mg total) by mouth daily.   cetirizine (ZYRTEC) 10 MG tablet Take 10 mg by mouth daily.   LORazepam  (ATIVAN ) 0.5 MG tablet Take 1 tablet (0.5 mg total) by mouth 2 (two) times daily as needed (dizziness).   losartan -hydrochlorothiazide (HYZAAR) 100-25 MG tablet Take 1 tablet by mouth daily.   Multiple Vitamins-Minerals (CENTRUM SILVER 50+WOMEN PO) Take by mouth.   rosuvastatin  (CRESTOR ) 10 MG tablet Take 1 tablet (10 mg total) by mouth daily.   traMADol  (ULTRAM ) 50 MG tablet Take 1 tablet (50 mg total) by mouth every 6 (six) hours as needed.   UNABLE TO FIND Med Name: Align GI one capsule daily   vitamin B-12 (CYANOCOBALAMIN) 1000 MCG tablet Take by mouth.   alendronate  (FOSAMAX ) 70 MG tablet TAKE 1 TABLET EVERY 7 DAYS WITH A FULL GLASS OF WATER ON AN EMPTY STOMACH (Patient not taking: Reported on 09/01/2024)   [DISCONTINUED] penicillin  v potassium (VEETID) 500 MG tablet Take 1 tablet (500 mg total) by mouth 3 (three) times daily.   [DISCONTINUED] traMADol  (ULTRAM ) 50 MG tablet Take 0.5-1 tablets (25-50 mg total) by  mouth every 12 (twelve) hours as needed.   No facility-administered encounter medications on file as of 09/01/2024.    Allergies (verified) Sulfa antibiotics, Aspirin, and Codeine   History: Past Medical History:  Diagnosis Date   Allergy    Arthritis    Chicken pox    Colon polyp    Diverticulitis    GERD (gastroesophageal reflux disease)    Heart murmur    Hyperlipidemia    Hypertension    Past Surgical History:  Procedure Laterality Date   BREAST BIOPSY Left 05/24/2024   US  LT BREAST BX W LOC DEV 1ST LESION IMG BX SPEC US  GUIDE 05/24/2024 [facility]   BREAST BIOPSY Left 05/26/2024   MM LT BREAST BX W LOC DEV 1ST LESION IMAGE BX SPEC STEREO GUIDE  05/26/2024 [facility]   BREAST BIOPSY  07/12/2024   MM LT RADIOACTIVE SEED LOC MAMMO GUIDE 07/12/2024 [facility]   BREAST LUMPECTOMY WITH RADIOACTIVE SEED LOCALIZATION Left 07/13/2024   Procedure: BREAST LUMPECTOMY WITH RADIOACTIVE SEED LOCALIZATION;  Surgeon: Curvin Deward MOULD, MD;  Location: [facility];  Service: General;  Laterality: Left;  LEFT BREAST RADIOACTIVE SEED LOCALIZED LUMPECTOMY   Family History  Problem Relation Age of Onset   Cancer Sister        bladder cancer and lymphoma   Cancer Brother        thyroid  cancer   Social History   Socioeconomic History   Marital status: Single    Spouse name: Not on file   Number of children: Not on file   Years of education: Not on file   Highest education level: Doctorate  Occupational History   Not on file  Tobacco Use   Smoking status: Never   Smokeless tobacco: Never  Substance and Sexual Activity   Alcohol use: Yes    Alcohol/week: 4.0 standard drinks of alcohol    Types: 4 Shots of liquor per week    Comment: 5 times a week   Drug use: Never   Sexual activity: Not on file  Other Topics Concern   Not on file  Social History Narrative   Not on file   Social Drivers of Health   Financial Resource Strain: Low Risk  (09/01/2024)   Overall Financial Resource Strain (CARDIA)    Difficulty of Paying Living Expenses: Not very hard  Food Insecurity: No Food Insecurity (09/01/2024)   Hunger Vital Sign    Worried About Running Out of Food in the Last Year: Never true    Ran Out of Food in the Last Year: Never true  Transportation Needs: No Transportation Needs (09/01/2024)   PRAPARE - Administrator, Civil Service (Medical): No    Lack of Transportation (Non-Medical): No  Physical Activity: Insufficiently Active (09/01/2024)   Exercise Vital Sign    Days of Exercise per Week: 1 day    Minutes of Exercise per Session: 60 min  Stress: No Stress Concern Present (09/01/2024)   [employer] of Occupational Health - Occupational Stress Questionnaire    Feeling of Stress: Not at all  Social Connections: Moderately Isolated (09/01/2024)   Social Connection and Isolation Panel    Frequency of Communication with Friends and Family: More than three times a week    Frequency of Social Gatherings with Friends and Family: Never    Attends Religious Services: Never    Database administrator or Organizations: No    Attends Engineer, structural: More than 4 times  per year    Marital Status: Never married    Tobacco Counseling Counseling given: Not Answered    Clinical Intake:  Pre-visit preparation completed: Yes  Pain : No/denies pain Pain Score: 0-No pain     BMI - recorded: 23.5 Nutritional Status: BMI of 19-24  Normal Nutritional Risks: None Diabetes: No  Lab Results  Component Value Date   HGBA1C 5.6 09/28/2023   HGBA1C 5.7 03/25/2023   HGBA1C 5.8 09/24/2022     How often do you need to have someone help you when you read instructions, pamphlets, or other written materials from your doctor or pharmacy?: 1 - Never What is the last grade level you completed in school?: doctorate  Interpreter Needed?: No  Information entered by :: Lolita Libra, CMA(AAMA)   Activities of Daily Living     08/25/2024    7:32 PM 07/13/2024    9:48 AM  In your present state of health, do you have any difficulty performing the following activities:  Hearing? 1 1  Vision? 0 0  Difficulty concentrating or making decisions? 0 0  Walking or climbing stairs? 0   Dressing or bathing? 0   Doing errands, shopping? 0   Preparing Food and eating ? N   Using the Toilet? N   In the past six months, have you accidently leaked urine? Y   Comment Has to go when gets the urge. Not ready to discuss with PCP   Do you have problems with loss of bowel control? N   Managing your Medications? N   Managing your Finances? N   Housekeeping or managing your Housekeeping? N      Patient Care Team: Copland, [name], MD as PCP - General (Family Medicine) Lanny Callander, MD as Consulting Physician (Hematology and Oncology) Tyree Nanetta SAILOR, RN as Oncology Nurse Navigator Izell Domino, MD as Attending Physician (Radiation Oncology) Curvin Deward MOULD, MD as Consulting Physician (General Surgery) [facility], [facility]  I have updated your Care Teams any recent Medical Services you may have received from other providers in the past year.     Assessment:   This is a routine wellness examination for Leanny.  Hearing/Vision screen Hearing Screening - Comments:: Wears hearing aids Vision Screening - Comments:: Up to date with routine eye exams with [facility]    Goals Addressed               This Visit's Progress     Patient Stated (pt-stated)        To drink more water       Depression Screen     09/01/2024    2:01 PM 08/15/2024    8:42 AM 06/14/2024   11:31 AM 06/14/2024   11:27 AM 06/14/2024   10:25 AM 09/28/2023    8:28 AM 09/28/2023    8:27 AM  PHQ 2/9 Scores  PHQ - 2 Score 0 0 0 0 0 0 0  PHQ- 9 Score 8     0     Fall Risk     08/25/2024    7:32 PM 07/05/2024    8:49 AM 09/28/2023    8:27 AM 09/24/2022    8:48 AM 03/03/2022    8:17 AM  Fall Risk   Falls in the past year? 0 0 0 0 0  Number falls in past yr: 0  0 0 0  Injury with Fall? 0 0 0 0 0  Risk for fall due to : No Fall Risks  No Fall Risks    Follow up Education provided  Falls evaluation completed Falls evaluation completed       Data saved with a previous flowsheet row definition    MEDICARE RISK AT HOME:  Medicare Risk at Home Any stairs in or around the home?: (Patient-Rptd) Yes If so, are there any without handrails?: No Home free of loose throw rugs in walkways, pet beds, electrical cords, etc?: Yes Adequate lighting in your home to reduce risk of falls?: (Patient-Rptd) Yes Life alert?: (Patient-Rptd) No Use of a cane, walker or w/c?:  (Patient-Rptd) Yes Grab bars in the bathroom?: (Patient-Rptd) Yes Shower chair or bench in shower?: (Patient-Rptd) No Elevated toilet seat or a handicapped toilet?: (Patient-Rptd) No  TIMED UP AND GO:  Was the test performed?  Yes  Length of time to ambulate 10 feet: 7 sec Gait steady and fast without use of assistive device  Cognitive Function: 6CIT completed        09/01/2024    2:06 PM  6CIT Screen  What Year? 0 points  What month? 0 points  What time? 0 points  Count back from 20 0 points  Months in reverse 0 points  Repeat phrase 2 points  Total Score 2 points    Immunizations Immunization History  Administered Date(s) Administered   Hep A, Unspecified 08/19/1999, 06/25/2001   Hep B, Unspecified 05/24/2002, 06/23/2002, 11/23/2002   INFLUENZA, HIGH DOSE SEASONAL PF 09/07/2023, 08/17/2024   Influenza Split 09/15/2022, 09/30/2023   Influenza-Unspecified 09/13/2012, 09/24/2016, 09/13/2020, 09/03/2021   PFIZER Comirnaty(Gray Top)Covid-19 Tri-Sucrose Vaccine 04/01/2021   PFIZER(Purple Top)SARS-COV-2 Vaccination 01/12/2020, 02/02/2020, 05/01/2020, 09/07/2020, 09/07/2021   Pfizer Covid-19 Vaccine Bivalent Booster 5y-11y 04/25/2022   Pfizer(Comirnaty)Fall Seasonal Vaccine 12 years and older 09/26/2022, 03/28/2024   Pneumococcal Conjugate-13 09/02/2016   Pneumococcal Polysaccharide-23 08/25/2018   Polio, Unspecified 06/25/2001   Respiratory Syncytial Virus Vaccine,Recomb Aduvanted(Arexvy) 10/02/2022   Td 03/11/2021   Tdap 11/21/2010   Typhoid Live 09/23/2006, 08/26/2011   Zoster Recombinant(Shingrix) 04/17/2023, 07/27/2023   Zoster, Live 12/22/2013    Screening Tests Health Maintenance  Topic Date Due   Medicare Annual Wellness (AWV)  02/18/2023   COVID-19 Vaccine (10 - Pfizer risk 2024-25 season) 09/27/2024   DTaP/Tdap/Td (3 - Td or Tdap) 03/12/2031   Pneumococcal Vaccine: 50+ Years  Completed   Influenza Vaccine  Completed   DEXA SCAN  Completed   Zoster  Vaccines- Shingrix  Completed   HPV VACCINES  Aged Out   Meningococcal B Vaccine  Aged Out    Health Maintenance Items Addressed: DEXA ordered, colonoscopy due 05/2025 (Dr Marvis).  Additional Screening:  Vision Screening: Recommended annual ophthalmology exams for early detection of glaucoma and other disorders of the eye. Is the patient up to date with their annual eye exam?  Yes  Who is the provider or what is the name of the office in which the patient attends annual eye exams? [facility]  Dental Screening: Recommended annual dental exams for proper oral hygiene  Community Resource Referral / Chronic Care Management: CRR required this visit?  No   CCM required this visit?  No   Plan:    I have personally reviewed and noted the following in the patient's chart:   Medical and social history Use of alcohol, tobacco or illicit drugs  Current medications and supplements including opioid prescriptions. Patient is not currently taking opioid prescriptions. Functional ability and status Nutritional status Physical activity Advanced directives List of other physicians Hospitalizations, surgeries, and ER visits in previous  12 months Vitals Screenings to include cognitive, depression, and falls Referrals and appointments  In addition, I have reviewed and discussed with patient certain preventive protocols, quality metrics, and best practice recommendations. A written personalized care plan for preventive services as well as general preventive health recommendations were provided to patient.   Lolita Libra, CMA   09/01/2024   After Visit Summary: (In Person-Printed) AVS printed and given to the patient  Notes: see phone note

## 2024-09-02 NOTE — Addendum Note (Signed)
 Addended by: WATT RAISIN C on: 09/02/2024 01:02 PM   Modules accepted: Orders

## 2024-09-20 NOTE — Patient Instructions (Addendum)
 Good to see you today- I will be in touch with your labs  Recommend covid booster at your convenience We can decide about any further treatment for osteoporosis pending your upcoming bone density study   Assuming all is well please see me in about 6 months  Keep staying active, you look great!

## 2024-09-20 NOTE — Progress Notes (Addendum)
 [facility] [address], [address] [place], [place] [address] [phone]  Date:  09/26/2024   Name:  ELCIE Lucas   DOB:  October 19, 1941   MRN:  [id number]  PCP:  Watt [name], MD    Chief Complaint: Follow-up (Discuss changing Fosamax /3 weeks ago I had a tooth removed, it was badly infected/My usual back pain )   History of Present Illness:  Katrina Lucas is a 83 y.o. very pleasant female patient who presents with the following:  Pt seen today for periodic recheck Last visit with me was in April  History of HTN, hyperlipidemia, osteoporosis, polyneuropathy, mild prediabetes, occasional vertigo/ BPPV for which she may use lorazepam , breast cancer treated with lumpectomy 2025.  No further treatment is needed at this time   She has been on fosamax  but chose to stop using it recenlty.  She has a bone density scan pending later on this month  She had a lumpectomy for breast cancer over the summer- surgery for left breast done 7/23   She is a retired Engineer, civil (consulting), enjoys golf and exercising at [employer]- Flu shot is done Covid booster can be done at her convenience RSV and shingrix are UTD   Discussed the use of AI scribe software for clinical note transcription with the patient, who gave verbal consent to proceed.  History of Present Illness Katrina Lucas is an 83 year old female who presents for follow-up after lumpectomy and dental surgery.  She underwent a lumpectomy for breast cancer on July 13, 2024, and has been discharged from further cancer treatment, requiring no radiation or chemotherapy. The summer was challenging due to frequent medical appointments, but she is now attempting to return to her usual activities, including going to the gym once or twice a week and golfing occasionally. Her social schedule has been impacted by weather and appointments.  She had a tooth extraction three weeks ago due  to a bad infection, identified by an abscess on the gum. A prior root canal was unsuccessful, leading to the decision to remove the tooth. She feels better since the extraction and has a follow-up appointment scheduled for this coming Wednesday.  She has a history of osteoporosis and was previously on Fosamax , which she stopped prior to her dental surgery due to concerns about its impact on her dental health. She is awaiting a bone density test scheduled for October 10, 2024, to assess her current bone health. In any case it looks like Fosamax  may not have been working very well for her- might want to consider prolia   She experiences chronic back pain, described as a cramp, which sometimes radiates to her shoulders. She uses tramadol  for pain management but tries to limit its use. She inquires about the potential benefit of a muscle relaxant at night. Weekly massages help alleviate her pain, reducing the need for pain medication on those days.  She reports that her blood pressure usually decreases a few hours after taking her medication. She has a wrist blood pressure cuff at home but has not been regularly monitoring her blood pressure.  She has received her flu shot and is waiting for the new COVID vaccine, which she plans to get at a local pharmacy.    Patient Active Problem List   Diagnosis Date Noted   Ductal carcinoma in situ (DCIS) of left breast 06/14/2024   Osteoarthritis of knees, bilateral 03/31/2024   Osteoporosis  08/31/2019   Pre-diabetes 08/31/2019   Essential hypertension 08/25/2018   Peripheral polyneuropathy 08/25/2018   Dyslipidemia 08/25/2018   Bilateral hearing loss 08/25/2018    Past Medical History:  Diagnosis Date   Allergy    Arthritis    Chicken pox    Colon polyp    Diverticulitis    GERD (gastroesophageal reflux disease)    Heart murmur    Hyperlipidemia    Hypertension     Past Surgical History:  Procedure Laterality Date   BREAST BIOPSY Left  05/24/2024   US  LT BREAST BX W LOC DEV 1ST LESION IMG BX SPEC US  GUIDE 05/24/2024 [facility]   BREAST BIOPSY Left 05/26/2024   MM LT BREAST BX W LOC DEV 1ST LESION IMAGE BX SPEC STEREO GUIDE 05/26/2024 [facility]   BREAST BIOPSY  07/12/2024   MM LT RADIOACTIVE SEED LOC MAMMO GUIDE 07/12/2024 [facility]   BREAST LUMPECTOMY WITH RADIOACTIVE SEED LOCALIZATION Left 07/13/2024   Procedure: BREAST LUMPECTOMY WITH RADIOACTIVE SEED LOCALIZATION;  Surgeon: Curvin Deward MOULD, MD;  Location: [facility];  Service: General;  Laterality: Left;  LEFT BREAST RADIOACTIVE SEED LOCALIZED LUMPECTOMY    Social History   Tobacco Use   Smoking status: Never   Smokeless tobacco: Never  Substance Use Topics   Alcohol use: Yes    Alcohol/week: 4.0 standard drinks of alcohol    Types: 4 Shots of liquor per week    Comment: 5 times a week   Drug use: Never    Family History  Problem Relation Age of Onset   Cancer Sister        bladder cancer and lymphoma   Cancer Brother        thyroid  cancer    Allergies  Allergen Reactions   Sulfa Antibiotics Shortness Of Breath   Aspirin     Microscopic bleeding Microscopic bleeding    Codeine Nausea And Vomiting    Medication list has been reviewed and updated.  Current Outpatient Medications on File Prior to Visit  Medication Sig Dispense Refill   acetaminophen  (TYLENOL ) 325 MG tablet Take 650 mg by mouth.      amLODipine  (NORVASC ) 5 MG tablet Take 1 tablet (5 mg total) by mouth daily. 90 tablet 3   cetirizine (ZYRTEC) 10 MG tablet Take 10 mg by mouth daily.     LORazepam  (ATIVAN ) 0.5 MG tablet Take 1 tablet (0.5 mg total) by mouth 2 (two) times daily as needed (dizziness). 30 tablet 0   losartan -hydrochlorothiazide (HYZAAR) 100-25 MG tablet Take 1 tablet by mouth daily. 90 tablet 3   Multiple Vitamins-Minerals (CENTRUM SILVER 50+WOMEN PO) Take by mouth.     traMADol  (ULTRAM ) 50 MG tablet Take 1 tablet (50 mg total) by mouth every  6 (six) hours as needed. 10 tablet 0   UNABLE TO FIND Med Name: Align GI one capsule daily     vitamin B-12 (CYANOCOBALAMIN) 1000 MCG tablet Take by mouth.     No current facility-administered medications on file prior to visit.    Review of Systems:  As per HPI- otherwise negative.   Physical Examination: Vitals:   09/26/24 0803 09/26/24 0831  BP: (!) 140/74 (!) 148/80  Pulse: 69   Temp: 97.8 F (36.6 C)   SpO2: 96%    Vitals:   09/26/24 0803  Weight: 160 lb 6.4 oz (72.8 kg)  Height: 5' 10 (1.778 m)   Body mass index is 23.02 kg/m. Ideal Body Weight: Weight in (lb) to  have BMI = 25: 173.9  GEN: no acute distress. Normal weight, looks well  She is HOH but very spry for age  HEENT: Atraumatic, Normocephalic.  Ears and Nose: No external deformity. CV: RRR, No M/G/R. No JVD. No thrill. No extra heart sounds. PULM: CTA B, no wheezes, crackles, rhonchi. No retractions. No resp. distress. No accessory muscle use. ABD: S, NT, ND, +BS. No rebound. No HSM. EXTR: No c/c/e PSYCH: Normally interactive. Conversant.  No bony tenderness of her spine   BP Readings from Last 3 Encounters:  09/26/24 (!) 148/80  09/01/24 124/63  08/15/24 138/72      Assessment and Plan: Essential hypertension - Plan: Comprehensive metabolic panel with GFR  Age related osteoporosis, unspecified pathological fracture presence  Dyslipidemia - Plan: Lipid panel, rosuvastatin  (CRESTOR ) 10 MG tablet  Pre-diabetes - Plan: Hemoglobin A1c  HOH (hard of hearing)  Chronic bilateral low back pain without sciatica - Plan: methocarbamol (ROBAXIN) 500 MG tablet  Assessment & Plan Essential hypertension Blood pressure slightly elevated at 150 systolic, previously normal. - Monitor blood pressure at home and report if consistently high. - Provide parameters for blood pressure monitoring. - Reassess in 6 months unless blood pressure issues arise.  Osteoporosis Stopped Fosamax  due to dental issues.  Bone density test scheduled for October 20th. Prefers to avoid medication if possible. - Review bone density results once available. - Discuss potential treatment options based on bone density results. ?prolia   Chronic bilateral low back pain Pain is cramp-like and muscular. Uses tramadol  sparingly and receives weekly massages which help alleviate pain. - Prescribe a short course of muscle relaxants for nighttime use. - Continue tramadol  as needed for pain management. - Continue weekly massages.  Hyperlipidemia She is on Crestor . - Refill Crestor  prescription due on October 9th.  History of breast cancer, status post lumpectomy Lumpectomy performed on July 23rd. Considered cured with no further treatment required.  History of dental abscess, status post extraction Tooth extraction performed three weeks ago due to infection. Reports feeling better post-extraction. - Follow up with oral surgeon as scheduled.  General Health Maintenance Received flu vaccine and awaiting new COVID vaccine. - Receive COVID vaccine at preferred location.  Follow-Up Routine follow-up planned in 6 months unless issues arise with blood pressure or other conditions. - Schedule follow-up appointment in 6 months.  Signed Harlene Schroeder, MD  Received labs as below, message to patient  Results for orders placed or performed in visit on 09/26/24  Comprehensive metabolic panel with GFR   Collection Time: 09/26/24  8:34 AM  Result Value Ref Range   Sodium 139 135 - 145 mEq/L   Potassium 3.9 3.5 - 5.1 mEq/L   Chloride 101 96 - 112 mEq/L   CO2 27 19 - 32 mEq/L   Glucose, Bld 93 70 - 99 mg/dL   BUN 20 6 - 23 mg/dL   Creatinine, Ser 9.12 0.40 - 1.20 mg/dL   Total Bilirubin 0.5 0.2 - 1.2 mg/dL   Alkaline Phosphatase 45 39 - 117 U/L   AST 18 0 - 37 U/L   ALT 12 0 - 35 U/L   Total Protein 6.8 6.0 - 8.3 g/dL   Albumin 4.4 3.5 - 5.2 g/dL   GFR 39.18 >39.99 mL/min   Calcium  10.4 8.4 - 10.5 mg/dL   Hemoglobin J8r   Collection Time: 09/26/24  8:34 AM  Result Value Ref Range   Hgb A1c MFr Bld 5.9 4.6 - 6.5 %  Lipid panel   Collection  Time: 09/26/24  8:34 AM  Result Value Ref Range   Cholesterol 122 0 - 200 mg/dL   Triglycerides 854.9 0.0 - 149.0 mg/dL   HDL 49.79 >60.99 mg/dL   VLDL 70.9 0.0 - 59.9 mg/dL   LDL Cholesterol 43 0 - 99 mg/dL   Total CHOL/HDL Ratio 2    NonHDL 71.89

## 2024-09-26 ENCOUNTER — Encounter: Payer: Self-pay | Admitting: Family Medicine

## 2024-09-26 ENCOUNTER — Ambulatory Visit: Admitting: Family Medicine

## 2024-09-26 ENCOUNTER — Other Ambulatory Visit (HOSPITAL_BASED_OUTPATIENT_CLINIC_OR_DEPARTMENT_OTHER): Payer: Self-pay

## 2024-09-26 VITALS — BP 148/80 | HR 69 | Temp 97.8°F | Ht 70.0 in | Wt 160.4 lb

## 2024-09-26 DIAGNOSIS — M810 Age-related osteoporosis without current pathological fracture: Secondary | ICD-10-CM | POA: Diagnosis not present

## 2024-09-26 DIAGNOSIS — R7303 Prediabetes: Secondary | ICD-10-CM | POA: Diagnosis not present

## 2024-09-26 DIAGNOSIS — E785 Hyperlipidemia, unspecified: Secondary | ICD-10-CM

## 2024-09-26 DIAGNOSIS — I10 Essential (primary) hypertension: Secondary | ICD-10-CM | POA: Diagnosis not present

## 2024-09-26 DIAGNOSIS — G8929 Other chronic pain: Secondary | ICD-10-CM

## 2024-09-26 DIAGNOSIS — H9190 Unspecified hearing loss, unspecified ear: Secondary | ICD-10-CM

## 2024-09-26 DIAGNOSIS — M545 Low back pain, unspecified: Secondary | ICD-10-CM

## 2024-09-26 LAB — COMPREHENSIVE METABOLIC PANEL WITH GFR
ALT: 12 U/L (ref 0–35)
AST: 18 U/L (ref 0–37)
Albumin: 4.4 g/dL (ref 3.5–5.2)
Alkaline Phosphatase: 45 U/L (ref 39–117)
BUN: 20 mg/dL (ref 6–23)
CO2: 27 meq/L (ref 19–32)
Calcium: 10.4 mg/dL (ref 8.4–10.5)
Chloride: 101 meq/L (ref 96–112)
Creatinine, Ser: 0.87 mg/dL (ref 0.40–1.20)
GFR: 60.81 mL/min (ref >60.00)
Glucose, Bld: 93 mg/dL (ref 70–99)
Potassium: 3.9 meq/L (ref 3.5–5.1)
Sodium: 139 meq/L (ref 135–145)
Total Bilirubin: 0.5 mg/dL (ref 0.2–1.2)
Total Protein: 6.8 g/dL (ref 6.0–8.3)

## 2024-09-26 LAB — LIPID PANEL
Cholesterol: 122 mg/dL (ref 0–200)
HDL: 50.2 mg/dL (ref >39.00)
LDL Cholesterol: 43 mg/dL (ref 0–99)
NonHDL: 71.89
Total CHOL/HDL Ratio: 2
Triglycerides: 145 mg/dL (ref 0.0–149.0)
VLDL: 29 mg/dL (ref 0.0–40.0)

## 2024-09-26 LAB — HEMOGLOBIN A1C: Hgb A1c MFr Bld: 5.9 % (ref 4.6–6.5)

## 2024-09-26 MED ORDER — METHOCARBAMOL 500 MG PO TABS
500.0000 mg | ORAL_TABLET | Freq: Three times a day (TID) | ORAL | 0 refills | Status: AC | PRN
  Filled 2024-09-26: qty 20, 7d supply, fill #0

## 2024-09-26 MED ORDER — ROSUVASTATIN CALCIUM 10 MG PO TABS: 10.0000 mg | ORAL_TABLET | Freq: Every day | ORAL | 3 refills | Status: AC

## 2024-10-10 ENCOUNTER — Ambulatory Visit (HOSPITAL_BASED_OUTPATIENT_CLINIC_OR_DEPARTMENT_OTHER)
Admission: RE | Admit: 2024-10-10 | Discharge: 2024-10-10 | Disposition: A | Discharge status: Home / Self Care | Source: Ambulatory Visit | Attending: Family Medicine | Admitting: Family Medicine

## 2024-10-10 DIAGNOSIS — M810 Age-related osteoporosis without current pathological fracture: Secondary | ICD-10-CM

## 2024-10-24 ENCOUNTER — Encounter: Payer: Self-pay | Admitting: Radiology

## 2024-10-24 DIAGNOSIS — L820 Inflamed seborrheic keratosis: Secondary | ICD-10-CM | POA: Diagnosis not present

## 2024-10-31 ENCOUNTER — Other Ambulatory Visit: Payer: Self-pay

## 2024-11-01 ENCOUNTER — Encounter: Admitting: Nurse Practitioner

## 2024-11-07 DIAGNOSIS — K080 Exfoliation of teeth due to systemic causes: Secondary | ICD-10-CM | POA: Diagnosis not present

## 2024-11-22 ENCOUNTER — Ambulatory Visit (HOSPITAL_BASED_OUTPATIENT_CLINIC_OR_DEPARTMENT_OTHER)
Admission: RE | Admit: 2024-11-22 | Discharge: 2024-11-22 | Disposition: A | Discharge status: Home / Self Care | Source: Ambulatory Visit | Attending: Family Medicine | Admitting: Family Medicine

## 2024-11-22 ENCOUNTER — Encounter: Payer: Self-pay | Admitting: Family Medicine

## 2024-11-22 DIAGNOSIS — Z780 Asymptomatic menopausal state: Secondary | ICD-10-CM | POA: Diagnosis not present

## 2024-11-22 DIAGNOSIS — M810 Age-related osteoporosis without current pathological fracture: Secondary | ICD-10-CM | POA: Diagnosis not present

## 2024-12-07 ENCOUNTER — Other Ambulatory Visit: Payer: Self-pay | Admitting: Family Medicine

## 2024-12-07 ENCOUNTER — Ambulatory Visit (INDEPENDENT_AMBULATORY_CARE_PROVIDER_SITE_OTHER): Admitting: Otolaryngology

## 2024-12-07 ENCOUNTER — Encounter (INDEPENDENT_AMBULATORY_CARE_PROVIDER_SITE_OTHER): Payer: Self-pay | Admitting: Otolaryngology

## 2024-12-07 VITALS — BP 155/69 | HR 85 | Ht 70.0 in | Wt 162.0 lb

## 2024-12-07 DIAGNOSIS — H903 Sensorineural hearing loss, bilateral: Secondary | ICD-10-CM

## 2024-12-07 NOTE — Progress Notes (Signed)
 Otolaryngology Clinic Note HPI:  Katrina Lucas is a 83 y.o. female kindly for evaluation of hearing loss  Initial visit (11/2024): Discussed the use of AI scribe software for clinical note transcription with the patient, who gave verbal consent to proceed.  History of Present Illness Katrina Lucas is an 83 year old female who presents with worsening hearing loss. She was referred by the [facility] for evaluation of her hearing loss.  She has long-standing hearing loss treated with hearing aids, with noticeable worsening since her last audiology visit four years ago. She is planned for new hearing aids in Jan 2026.  Patient denies: ear pain, fullness, vertigo, drainage, tinnitus Patient additionally denies: deep pain in ear canal, eustachian tube symptoms such as popping, crackling, sensitive to pressure changes Patient also denies barotrauma, vestibular suppressant use, ototoxic medication use Prior ear surgery: no No frequent ear infections. She does have history of occupational noise exposure in [employer]. 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 Audio independently interpreted :A/A tymps, b/l mod to profound SNHL which appears to have worsened in lower freq but WRT decreased (reportedly) --- WRT 14% at 95dB HL and 22% at 95dB HL -- WRT was 58% b/l in 2021   2021 audio:  PMH/Meds/All/SocHx/FamHx/ROS:   Past Medical History:   Diagnosis Date   Allergy    Arthritis    Chicken pox    Colon polyp    Diverticulitis    GERD (gastroesophageal reflux disease)    Heart murmur    Hyperlipidemia    Hypertension      Past Surgical History:  Procedure Laterality Date   BREAST BIOPSY Left 05/24/2024   US  LT BREAST BX W LOC DEV 1ST LESION IMG BX SPEC US  GUIDE 05/24/2024 [facility]   BREAST BIOPSY Left 05/26/2024   MM LT BREAST BX W LOC DEV 1ST LESION IMAGE BX SPEC STEREO GUIDE 05/26/2024 [facility]   BREAST BIOPSY  07/12/2024   MM LT RADIOACTIVE SEED LOC MAMMO GUIDE 07/12/2024 [facility]   BREAST LUMPECTOMY WITH RADIOACTIVE SEED LOCALIZATION Left 07/13/2024   Procedure: BREAST LUMPECTOMY WITH RADIOACTIVE SEED LOCALIZATION;  Surgeon: Curvin Deward MOULD, MD;  Location: [facility];  Service: General;  Laterality: Left;  LEFT BREAST RADIOACTIVE SEED LOCALIZED LUMPECTOMY    Family History  Problem Relation Age of Onset   Cancer Sister        bladder cancer and lymphoma   Cancer Brother        thyroid  cancer     Social Connections: Moderately Isolated (09/01/2024)   Social Connection and Isolation Panel    Frequency of Communication with Friends and Family: More than three times a week    Frequency of Social Gatherings with Friends and Family: Never    Attends Religious Services: Never    Database Administrator or Organizations: No    Attends Engineer, Structural: More than 4 times per year    Marital Status: Never married  Current Medications[1]   Physical Exam:   BP (!) 155/69 (BP Location: Right Arm, Patient Position: Sitting, Cuff Size: Normal)   Pulse 85   Ht 5' 10 (1.778 m)   Wt 162 lb (73.5 kg)   SpO2 98%   BMI 23.24 kg/m   Salient findings:  CN II-XII intact Given history and complaints, ear microscopy was indicated and performed for evaluation with findings as below in physical exam section and in procedures; Bilateral EAC clear and TM intact with well  pneumatized middle ear spaces Anterior rhinoscopy: Septum intact; bilateral inferior turbinates without significant hypertrophy No lesions of oral cavity/oropharynx No obviously palpable neck masses/lymphadenopathy/thyromegaly No respiratory distress or stridor  Seprately Identifiable Procedures:  Prior to initiating any procedures, risks/benefits/alternatives were explained to the patient and verbal consent obtained. Procedure: Bilateral ear microscopy using microscope (CPT [id number]) Pre-procedure diagnosis: Bilateral profound hearing loss Post-procedure diagnosis: same Indication: see above; given patient's otologic complaints and history, for improved and comprehensive examination of external ear and tympanic membrane, bilateral otologic examination using microscope was performed. Prior to proceeding, verbal consent was obtained after discussion of R/B/A  Procedure: Patient was placed semi-recumbent. Both ear canals were examined using the microscope with findings above. Patient tolerated the procedure well.   Impression & Plans:  Katrina Lucas is a 83 y.o. female with:  1. Sensorineural hearing loss (SNHL) of both ears    Bilateral sensorineural hearing loss, declining and now profound with poor WRT Symmetric loss, mild in lower frequencies, profound in higher. Cochlear implant discussed but she is not currently interested. - Scheduled repeat hearing test in early March to confirm and can re-visit CI at that point - Proceed with fitting of new hearing aids on January 28th.  See below regarding exact medications prescribed this encounter including dosages and route: No orders of the defined types were placed in this encounter.     Thank you for allowing me the opportunity to care for your patient. Please do not hesitate to contact me should you have any other questions.  Sincerely, Eldora Blanch, MD Otolaryngologist (ENT), [facility] Phone: [phone] Fax:  [phone]  12/07/2024, 9:13 AM   MDM:  Level 4 - [phone] Complexity/Problems addressed: mod - chronic worsening problem/exacerbation Data complexity: mod - independent interpretation of outside test; review of note, labs - Morbidity: low  - Prescription Drug prescribed or managed: n      [1]  Current Outpatient Medications:    acetaminophen  (TYLENOL ) 325 MG tablet, Take 650 mg by mouth. , Disp: , Rfl:    amLODipine  (NORVASC ) 5 MG tablet, Take 1 tablet (5 mg total) by mouth daily., Disp: 90 tablet, Rfl: 3   cetirizine (ZYRTEC) 10 MG tablet, Take 10 mg by mouth daily., Disp: , Rfl:    LORazepam  (ATIVAN ) 0.5 MG tablet, Take 1 tablet (0.5 mg total) by mouth 2 (two) times daily as needed (dizziness)., Disp: 30 tablet, Rfl: 0   losartan -hydrochlorothiazide (HYZAAR) 100-25 MG tablet, Take 1 tablet by mouth daily., Disp: 90 tablet, Rfl: 3   methocarbamol  (ROBAXIN ) 500 MG tablet, Take 1 tablet (500 mg total) by mouth every 8 (eight) hours as needed for muscle spasms., Disp: 20 tablet, Rfl: 0   Multiple Vitamins-Minerals (CENTRUM SILVER 50+WOMEN PO), Take by mouth., Disp: , Rfl:    rosuvastatin  (CRESTOR ) 10 MG tablet, Take 1 tablet (10 mg total) by mouth daily., Disp: 90 tablet, Rfl: 3   traMADol  (ULTRAM ) 50 MG tablet, Take 1 tablet (50 mg total) by mouth  every 6 (six) hours as needed., Disp: 10 tablet, Rfl: 0   UNABLE TO FIND, Med Name: Align GI one capsule daily, Disp: , Rfl:    vitamin B-12 (CYANOCOBALAMIN ) 1000 MCG tablet, Take by mouth., Disp: , Rfl:

## 2024-12-10 ENCOUNTER — Other Ambulatory Visit: Payer: Self-pay | Admitting: Family Medicine

## 2024-12-13 ENCOUNTER — Encounter: Payer: Self-pay | Admitting: Family Medicine

## 2024-12-28 ENCOUNTER — Telehealth: Payer: Self-pay | Admitting: Family Medicine

## 2024-12-28 NOTE — Telephone Encounter (Signed)
 Pt dropped of forms she would like her pcp to look at. Forms were placed in pcps box.

## 2024-12-29 NOTE — Telephone Encounter (Signed)
 Paperwork has been given to PCP.

## 2024-12-30 NOTE — Telephone Encounter (Signed)
 Dr Laneta is now at a new office [phone] [facility] I will need to call back later as they are not open

## 2025-01-06 ENCOUNTER — Other Ambulatory Visit (HOSPITAL_COMMUNITY): Payer: Self-pay

## 2025-01-10 ENCOUNTER — Other Ambulatory Visit (HOSPITAL_COMMUNITY): Payer: Self-pay

## 2025-01-21 ENCOUNTER — Encounter: Payer: Self-pay | Admitting: Family Medicine

## 2025-01-21 DIAGNOSIS — M810 Age-related osteoporosis without current pathological fracture: Secondary | ICD-10-CM

## 2025-01-24 MED ORDER — DENOSUMAB 60 MG/ML ~~LOC~~ SOSY: 60.0000 mg | PREFILLED_SYRINGE | Freq: Once | SUBCUTANEOUS | Status: AC

## 2025-01-26 ENCOUNTER — Telehealth: Payer: Self-pay

## 2025-01-26 ENCOUNTER — Other Ambulatory Visit (HOSPITAL_COMMUNITY): Payer: Self-pay

## 2025-01-26 NOTE — Telephone Encounter (Signed)
 Prolia  VOB initiated via [email or web address]  Next Prolia  inj DUE: NEW START

## 2025-01-27 NOTE — Progress Notes (Signed)
 Katrina Lucas                                          MRN: [id number]   01/27/2025   The [facility] Quality Team Specialist reviewed this patient medical record for the purposes of chart review for care gap closure. The following were reviewed: chart review for care gap closure-controlling blood pressure.    [facility] Quality Team

## 2025-03-13 ENCOUNTER — Ambulatory Visit (INDEPENDENT_AMBULATORY_CARE_PROVIDER_SITE_OTHER): Admitting: Otolaryngology

## 2025-03-13 ENCOUNTER — Ambulatory Visit (INDEPENDENT_AMBULATORY_CARE_PROVIDER_SITE_OTHER): Admitting: Audiology

## 2025-03-20 ENCOUNTER — Ambulatory Visit (INDEPENDENT_AMBULATORY_CARE_PROVIDER_SITE_OTHER): Admitting: Otolaryngology

## 2025-03-20 ENCOUNTER — Ambulatory Visit (INDEPENDENT_AMBULATORY_CARE_PROVIDER_SITE_OTHER): Admitting: Audiology

## 2025-04-10 ENCOUNTER — Ambulatory Visit: Admitting: Family Medicine

## 2025-09-06 ENCOUNTER — Ambulatory Visit
